# Patient Record
Sex: Female | Born: 1945 | Race: Black or African American | Hispanic: No | Smoking: Former smoker
Health system: Southern US, Community
[De-identification: ages and names within clinical notes are randomized; demographics above are authoritative.]

---

## 2012-03-10 ENCOUNTER — Other Ambulatory Visit (HOSPITAL_COMMUNITY): Payer: Self-pay | Admitting: Endocrinology

## 2012-03-10 DIAGNOSIS — E0590 Thyrotoxicosis, unspecified without thyrotoxic crisis or storm: Secondary | ICD-10-CM

## 2012-03-22 ENCOUNTER — Encounter (HOSPITAL_COMMUNITY)
Admission: RE | Admit: 2012-03-22 | Discharge: 2012-03-22 | Disposition: A | Discharge status: Home / Self Care | Payer: Federal, State, Local not specified - PPO | Source: Ambulatory Visit | Attending: Endocrinology | Admitting: Endocrinology

## 2012-03-22 DIAGNOSIS — E049 Nontoxic goiter, unspecified: Secondary | ICD-10-CM | POA: Insufficient documentation

## 2012-03-22 DIAGNOSIS — E0590 Thyrotoxicosis, unspecified without thyrotoxic crisis or storm: Secondary | ICD-10-CM

## 2012-03-22 MED ORDER — SODIUM IODIDE I 131 CAPSULE
8.9000 | Freq: Once | INTRAVENOUS | Status: AC | PRN
  Administered 2012-03-22: 8.9 via ORAL

## 2012-03-23 ENCOUNTER — Encounter (HOSPITAL_COMMUNITY)
Admission: RE | Admit: 2012-03-23 | Discharge: 2012-03-23 | Disposition: A | Discharge status: Home / Self Care | Payer: Federal, State, Local not specified - PPO | Source: Ambulatory Visit | Attending: Endocrinology | Admitting: Endocrinology

## 2012-03-23 MED ORDER — SODIUM PERTECHNETATE TC 99M INJECTION
10.0000 | Freq: Once | INTRAVENOUS | Status: AC | PRN
  Administered 2012-03-23: 10 via INTRAVENOUS

## 2018-05-12 ENCOUNTER — Inpatient Hospital Stay (HOSPITAL_COMMUNITY)
Admission: AD | Admit: 2018-05-12 | Discharge: 2018-05-17 | DRG: 922 | Disposition: A | Discharge status: Home / Self Care | Payer: Medicare Other | Source: Other Acute Inpatient Hospital | Attending: Internal Medicine | Admitting: Internal Medicine

## 2018-05-12 DIAGNOSIS — Z79899 Other long term (current) drug therapy: Secondary | ICD-10-CM | POA: Diagnosis not present

## 2018-05-12 DIAGNOSIS — T40601S Poisoning by unspecified narcotics, accidental (unintentional), sequela: Principal | ICD-10-CM

## 2018-05-12 DIAGNOSIS — I21A1 Myocardial infarction type 2: Secondary | ICD-10-CM | POA: Diagnosis not present

## 2018-05-12 DIAGNOSIS — G92 Toxic encephalopathy: Secondary | ICD-10-CM | POA: Diagnosis not present

## 2018-05-12 DIAGNOSIS — E876 Hypokalemia: Secondary | ICD-10-CM | POA: Diagnosis not present

## 2018-05-12 DIAGNOSIS — G931 Anoxic brain damage, not elsewhere classified: Secondary | ICD-10-CM | POA: Diagnosis present

## 2018-05-12 DIAGNOSIS — I6340 Cerebral infarction due to embolism of unspecified cerebral artery: Secondary | ICD-10-CM | POA: Diagnosis present

## 2018-05-12 DIAGNOSIS — M545 Low back pain: Secondary | ICD-10-CM | POA: Diagnosis not present

## 2018-05-12 DIAGNOSIS — Z7901 Long term (current) use of anticoagulants: Secondary | ICD-10-CM | POA: Diagnosis not present

## 2018-05-12 DIAGNOSIS — I129 Hypertensive chronic kidney disease with stage 1 through stage 4 chronic kidney disease, or unspecified chronic kidney disease: Secondary | ICD-10-CM | POA: Diagnosis present

## 2018-05-12 DIAGNOSIS — I119 Hypertensive heart disease without heart failure: Secondary | ICD-10-CM | POA: Diagnosis present

## 2018-05-12 DIAGNOSIS — N179 Acute kidney failure, unspecified: Secondary | ICD-10-CM

## 2018-05-12 DIAGNOSIS — I2510 Atherosclerotic heart disease of native coronary artery without angina pectoris: Secondary | ICD-10-CM | POA: Diagnosis not present

## 2018-05-12 DIAGNOSIS — G894 Chronic pain syndrome: Secondary | ICD-10-CM | POA: Diagnosis not present

## 2018-05-12 DIAGNOSIS — I10 Essential (primary) hypertension: Secondary | ICD-10-CM | POA: Diagnosis present

## 2018-05-12 DIAGNOSIS — I214 Non-ST elevation (NSTEMI) myocardial infarction: Secondary | ICD-10-CM | POA: Diagnosis present

## 2018-05-12 DIAGNOSIS — E669 Obesity, unspecified: Secondary | ICD-10-CM | POA: Diagnosis present

## 2018-05-12 DIAGNOSIS — Z9861 Coronary angioplasty status: Secondary | ICD-10-CM

## 2018-05-12 DIAGNOSIS — M79605 Pain in left leg: Secondary | ICD-10-CM | POA: Diagnosis not present

## 2018-05-12 DIAGNOSIS — J9601 Acute respiratory failure with hypoxia: Secondary | ICD-10-CM | POA: Diagnosis not present

## 2018-05-12 DIAGNOSIS — M5136 Other intervertebral disc degeneration, lumbar region: Secondary | ICD-10-CM | POA: Diagnosis present

## 2018-05-12 DIAGNOSIS — E1151 Type 2 diabetes mellitus with diabetic peripheral angiopathy without gangrene: Secondary | ICD-10-CM | POA: Diagnosis present

## 2018-05-12 DIAGNOSIS — T40601A Poisoning by unspecified narcotics, accidental (unintentional), initial encounter: Secondary | ICD-10-CM | POA: Diagnosis present

## 2018-05-12 DIAGNOSIS — T40601D Poisoning by unspecified narcotics, accidental (unintentional), subsequent encounter: Secondary | ICD-10-CM

## 2018-05-12 DIAGNOSIS — E785 Hyperlipidemia, unspecified: Secondary | ICD-10-CM | POA: Diagnosis present

## 2018-05-12 DIAGNOSIS — E0590 Thyrotoxicosis, unspecified without thyrotoxic crisis or storm: Secondary | ICD-10-CM | POA: Diagnosis present

## 2018-05-12 DIAGNOSIS — I169 Hypertensive crisis, unspecified: Secondary | ICD-10-CM

## 2018-05-12 DIAGNOSIS — I4891 Unspecified atrial fibrillation: Secondary | ICD-10-CM | POA: Diagnosis present

## 2018-05-12 DIAGNOSIS — I25119 Atherosclerotic heart disease of native coronary artery with unspecified angina pectoris: Secondary | ICD-10-CM

## 2018-05-12 DIAGNOSIS — I480 Paroxysmal atrial fibrillation: Secondary | ICD-10-CM | POA: Diagnosis not present

## 2018-05-12 DIAGNOSIS — Q6689 Other  specified congenital deformities of feet: Secondary | ICD-10-CM

## 2018-05-12 DIAGNOSIS — Z66 Do not resuscitate: Secondary | ICD-10-CM | POA: Diagnosis not present

## 2018-05-12 DIAGNOSIS — Z96641 Presence of right artificial hip joint: Secondary | ICD-10-CM | POA: Diagnosis present

## 2018-05-12 DIAGNOSIS — Z79891 Long term (current) use of opiate analgesic: Secondary | ICD-10-CM

## 2018-05-12 DIAGNOSIS — R2689 Other abnormalities of gait and mobility: Secondary | ICD-10-CM | POA: Diagnosis present

## 2018-05-12 DIAGNOSIS — J690 Pneumonitis due to inhalation of food and vomit: Secondary | ICD-10-CM | POA: Diagnosis not present

## 2018-05-12 DIAGNOSIS — E8770 Fluid overload, unspecified: Secondary | ICD-10-CM | POA: Diagnosis not present

## 2018-05-12 DIAGNOSIS — G8929 Other chronic pain: Secondary | ICD-10-CM | POA: Diagnosis not present

## 2018-05-12 DIAGNOSIS — R569 Unspecified convulsions: Secondary | ICD-10-CM | POA: Diagnosis present

## 2018-05-12 DIAGNOSIS — Z820 Family history of epilepsy and other diseases of the nervous system: Secondary | ICD-10-CM | POA: Diagnosis not present

## 2018-05-12 DIAGNOSIS — Z8673 Personal history of transient ischemic attack (TIA), and cerebral infarction without residual deficits: Secondary | ICD-10-CM | POA: Diagnosis not present

## 2018-05-12 DIAGNOSIS — Z6835 Body mass index (BMI) 35.0-35.9, adult: Secondary | ICD-10-CM

## 2018-05-12 DIAGNOSIS — Z713 Dietary counseling and surveillance: Secondary | ICD-10-CM

## 2018-05-12 DIAGNOSIS — S72002D Fracture of unspecified part of neck of left femur, subsequent encounter for closed fracture with routine healing: Secondary | ICD-10-CM | POA: Diagnosis not present

## 2018-05-12 DIAGNOSIS — G9340 Encephalopathy, unspecified: Secondary | ICD-10-CM | POA: Diagnosis not present

## 2018-05-12 DIAGNOSIS — I340 Nonrheumatic mitral (valve) insufficiency: Secondary | ICD-10-CM | POA: Diagnosis not present

## 2018-05-12 DIAGNOSIS — I248 Other forms of acute ischemic heart disease: Secondary | ICD-10-CM | POA: Diagnosis present

## 2018-05-12 DIAGNOSIS — N183 Chronic kidney disease, stage 3 (moderate): Secondary | ICD-10-CM | POA: Diagnosis present

## 2018-05-12 DIAGNOSIS — I6930 Unspecified sequelae of cerebral infarction: Secondary | ICD-10-CM | POA: Diagnosis not present

## 2018-05-12 MED ORDER — SODIUM CHLORIDE 0.9% FLUSH
3.0000 mL | Freq: Two times a day (BID) | INTRAVENOUS | Status: DC
  Administered 2018-05-13 – 2018-05-16 (×4): 3 mL via INTRAVENOUS

## 2018-05-12 MED ORDER — ACETAMINOPHEN 325 MG PO TABS
650.0000 mg | ORAL_TABLET | Freq: Four times a day (QID) | ORAL | Status: DC | PRN
  Administered 2018-05-14: 650 mg via ORAL
  Filled 2018-05-12: qty 2

## 2018-05-12 MED ORDER — ORAL CARE MOUTH RINSE
15.0000 mL | Freq: Two times a day (BID) | OROMUCOSAL | Status: DC
  Administered 2018-05-13 – 2018-05-17 (×5): 15 mL via OROMUCOSAL

## 2018-05-12 MED ORDER — SENNOSIDES-DOCUSATE SODIUM 8.6-50 MG PO TABS: 1.0000 | ORAL_TABLET | Freq: Every evening | ORAL | Status: DC | PRN

## 2018-05-12 MED ORDER — SODIUM CHLORIDE 0.9 % IV SOLN: 250.0000 mL | INTRAVENOUS | Status: DC | PRN

## 2018-05-12 MED ORDER — NALOXONE HCL 0.4 MG/ML IJ SOLN: 0.4000 mg | INTRAMUSCULAR | Status: DC | PRN

## 2018-05-12 MED ORDER — SODIUM CHLORIDE 0.9% FLUSH: 3.0000 mL | INTRAVENOUS | Status: DC | PRN

## 2018-05-12 MED ORDER — ACETAMINOPHEN 650 MG RE SUPP: 650.0000 mg | Freq: Four times a day (QID) | RECTAL | Status: DC | PRN

## 2018-05-12 MED ORDER — SODIUM CHLORIDE 0.9% FLUSH
3.0000 mL | Freq: Two times a day (BID) | INTRAVENOUS | Status: DC
  Administered 2018-05-13 – 2018-05-17 (×4): 3 mL via INTRAVENOUS

## 2018-05-12 MED ORDER — BISACODYL 10 MG RE SUPP: 10.0000 mg | Freq: Every day | RECTAL | Status: DC | PRN

## 2018-05-12 MED ORDER — ONDANSETRON HCL 4 MG PO TABS: 4.0000 mg | ORAL_TABLET | Freq: Four times a day (QID) | ORAL | Status: DC | PRN

## 2018-05-12 MED ORDER — ONDANSETRON HCL 4 MG/2ML IJ SOLN: 4.0000 mg | Freq: Four times a day (QID) | INTRAMUSCULAR | Status: DC | PRN

## 2018-05-12 NOTE — H&P (Signed)
History and Physical    Stephanie Hahn ZOX:[id number] DOB: 10-14-1945 DOA: 05/12/2018  PCP: Barron Alvine, MD   Patient coming from: Home, by way of [facility] ED  Chief Complaint: Found down, poorly responsive  HPI: Stephanie Hahn is a 72 y.o. female with medical history significant for chronic back pain, hyperthyroidism, coronary artery disease, hypertension, and depression, who presented to the [facility] emergency department with EMS after she was found on the floor at home, poorly responsive and hypoxic.  Family is at the bedside and assist with the history.  It had been approximately 24 hours since the family member had spoken with her on the phone, she did not return calls on the day of presentation, and when family went to check on her, she was found on the floor poorly responsive.  She was saturating in the 50s on EMS arrival with poor respiratory effort and poor responsiveness.  She responded to Narcan, had seizure-like activity that resolved with Versed, and was brought into the ED.  ED Course: Upon arrival to the ED, patient is found to be obtunded with poor respiratory effort, again responding well to Narcan, afebrile, tachycardic, with stable blood pressure, and saturating adequately on room air after Narcan administration.  EKG features sinus tachycardia with rate 113 and PVCs.  Noncontrast head CT was negative for acute intracranial abnormality.  Chest x-ray is notable for cardiomegaly, peribronchial thickening, and interstitial prominence, possibly suggestive of early interstitial edema.  Chemistry panel is notable for a creatinine of 2.40, up from a baseline of 1.0.  Ethanol level is undetectable and TSH was normal.  CBC features a leukocytosis to 17,100 with normal H&H and normal platelets.  Lactic acid was reassuringly normal, INR normal and urinalysis unremarkable.  Troponin was elevated at 1.78, decreased further to 3.0, and then 3.7 while in the ED.  Patient received Narcan multiple times,  Versed, 1 L of normal saline, duo nebs, cefepime, vancomycin, and Flagyl in the ED.  She was also given 125 mg of IV Solu-Medrol and started on a heparin infusion.  She has been transferred to stepdown unit at [facility] for further evaluation and management  Review of Systems:  Unable to complete ROS secondary .  History reviewed. No pertinent past medical history.  History reviewed. No pertinent surgical history.   has no tobacco, alcohol, and drug history on file. Unable to obtain secondary to patient's clinical condition.   Not on File  Family History  Problem Relation Age of Onset  . Alzheimer's disease Other   . Alzheimer's disease Other      Prior to Admission medications   Not on File    Physical Exam: Vitals:   05/12/18 2137 05/12/18 2141 05/12/18 2308 05/12/18 2326  BP: (!) 163/90   138/70  Pulse: 81   91  Resp: 12   (!) 9  Temp: 98.7 F (37.1 C)   98.4 F (36.9 C)  TempSrc: Oral   Oral  SpO2: 97%   97%  Weight:  95.4 kg    Height:   5\' 4"  (1.626 m)     Constitutional: NAD, calm  Eyes: PERTLA, lids and conjunctivae normal ENMT: Mucous membranes are moist. Posterior pharynx clear of any exudate or lesions.   Neck: normal, supple, no masses, no thyromegaly Respiratory: Scattered rales, no wheezes, no cough. No accessory muscle use.  Cardiovascular: S1 & S2 heard, regular rate and rhythm. Mild edema involving all extremities. Abdomen: No distension, no tenderness, soft. Bowel sounds active.  Musculoskeletal: no  clubbing / cyanosis. LLE shortened and externally rotated; pain with manipulation of left foot and ankle.  Skin: no significant rashes, lesions, ulcers. Warm, dry, well-perfused. Neurologic: Somnolent, easily woken. Makes eye-contact, but not speaking, does not seem to recognize family at bedside. No facial asymmetry, PERRL, EOMI. Moving all extremities.     Labs on Admission: I have personally reviewed following labs and imaging studies  CBC: Recent Labs    Lab June 01, 2018 0018  WBC 13.4*  NEUTROABS 10.4*  HGB 11.4*  HCT 38.4  MCV 96.5  PLT 147*   Basic Metabolic Panel: No results for input(s): NA, K, CL, CO2, GLUCOSE, BUN, CREATININE, CALCIUM, MG, PHOS in the last 168 hours. GFR: CrCl cannot be calculated (No successful lab value found.). Liver Function Tests: No results for input(s): AST, ALT, ALKPHOS, BILITOT, PROT, ALBUMIN in the last 168 hours. No results for input(s): LIPASE, AMYLASE in the last 168 hours. No results for input(s): AMMONIA in the last 168 hours. Coagulation Profile: No results for input(s): INR, PROTIME in the last 168 hours. Cardiac Enzymes: No results for input(s): CKTOTAL, CKMB, CKMBINDEX, TROPONINI in the last 168 hours. BNP (last 3 results) No results for input(s): PROBNP in the last 8760 hours. HbA1C: No results for input(s): HGBA1C in the last 72 hours. CBG: No results for input(s): GLUCAP in the last 168 hours. Lipid Profile: No results for input(s): CHOL, HDL, LDLCALC, TRIG, CHOLHDL, LDLDIRECT in the last 72 hours. Thyroid Function Tests: No results for input(s): TSH, T4TOTAL, FREET4, T3FREE, THYROIDAB in the last 72 hours. Anemia Panel: No results for input(s): VITAMINB12, FOLATE, FERRITIN, TIBC, IRON, RETICCTPCT in the last 72 hours. Urine analysis: No results found for: COLORURINE, APPEARANCEUR, LABSPEC, PHURINE, GLUCOSEU, HGBUR, BILIRUBINUR, KETONESUR, PROTEINUR, UROBILINOGEN, NITRITE, LEUKOCYTESUR Sepsis Labs: @LABRCNTIP (procalcitonin:4,lacticidven:4) ) Recent Results (from the past 240 hour(s))  MRSA PCR Screening     Status: None   Collection Time: 05/12/18  9:54 PM  Result Value Ref Range Status   MRSA by PCR NEGATIVE NEGATIVE Final    Comment:        The GeneXpert MRSA Assay (FDA approved for NASAL specimens only), is one component of a comprehensive MRSA colonization surveillance program. It is not intended to diagnose MRSA infection nor to guide or monitor treatment for MRSA  infections. Performed at [facility], [address], [place], [place] [address]      Radiological Exams on Admission: No results found.  EKG: Independently reviewed. Sinus tachycardia (rate 113), PVC's.   Assessment/Plan   1. Acute encephalopathy  - Presents after being found down at home, hypoxic with sat in 50's, low respiratory rate, poorly responsive, and with seizure-like episode; it had been ~24 hrs since a family had spoken to her on the phone  - Opiate overdose was suspected as she takes Percocet and responded well to Narcan  - Despite improvement in level of consciousness and respirations with Narcan, she remains disoriented, does not seem to recognize her family at bedside  - No acute findings noted on head CT in ED; TSH was also wnl in ED  - Discussed with neurology, their assistance much appreciated  - STAT head CT, EEG in am if not improving, neuro checks, seizure precautions, check B12, folate, ammonia, RPR   2. Acute respiratory failure with hypoxia  - She was hypoxic with sat in 50's on EMS arrival, improved with Narcan and was saturating mid-90's in ED  - This was suspected secondary to opiate overdose, but there was also  concern for aspiration or edema contributing  - She was started on broad-spectrum antibiotics in ED, remains afebrile, will continue Unasyn for now for possible aspiration PNA, repeat CXR now, check echocardiogram    3. Elevated troponin; CAD  - Troponin elevated to 1.78 -> 3.01 -> 3.70 in ED  - EKG without acute ischemic features in ED  - She has hx of PCI in 2012  - She was started on IV heparin infusion in ED  - Continue cardiac monitoring, continue IV heparin for now, repeat EKG, trend troponin, check echocardiogram    4. Seizure-like activity   - There was an episode of seizure-like activity in ED that resolved with Versed  - No acute findings on head CT in ED, repeating now  - Continue seizure precautions, EEG in am if her neuro  status fails to improve    5. Chronic pain  - She has chronic back pain managed at home with Percocet  - She is suspected to have suffered opiate overdose, responding to Narcan  - Appears comfortable at rest, but appears to be in pain with manipulation of left leg as discussed below  - Continue to avoid opiates for now   6. Acute left leg pain  - LLE appears shortened and externally rotated  - She appears to be in pain with manipulation of foot and ankle, but does not react to palpation of the hip  - Check radiographs    7. Acute kidney injury  - SCr is 2.40 in ED, up from baseline of ~1   - Appears hypervolemic on admission  - Had received a liter bolus in ED followed by LR infusion  - Check urine chemistries, repeat serum chemistries, renally-dose medications, avoid nephrotoxins    8. Hyperthyroidism  - TSH was wnl in ED on 12/4  - Resume Tapazole when more appropriate for diet   9. Hypertension  - BP at goal  - Treat as-needed for now, resume metoprolol if diet recommended by SLP      DVT prophylaxis: IV heparin infusion   Code Status: DNR  Family Communication: Son, Alinda Moneyony, updated at bedside Consults called: Neurology   Admission status: Inpatient     Briscoe Deutscherimothy S Jomayra Novitsky, MD [facility] Pager [phone]  If 7PM-7AM, please contact night-coverage [email or web address] Password [email or web address]  05/13/2018, 1:13 AM

## 2018-05-13 ENCOUNTER — Inpatient Hospital Stay (HOSPITAL_COMMUNITY): Payer: Medicare Other

## 2018-05-13 ENCOUNTER — Inpatient Hospital Stay (HOSPITAL_COMMUNITY): Payer: Federal, State, Local not specified - PPO

## 2018-05-13 ENCOUNTER — Encounter (HOSPITAL_COMMUNITY): Payer: Self-pay | Admitting: Family Medicine

## 2018-05-13 DIAGNOSIS — I639 Cerebral infarction, unspecified: Secondary | ICD-10-CM

## 2018-05-13 DIAGNOSIS — I10 Essential (primary) hypertension: Secondary | ICD-10-CM | POA: Diagnosis present

## 2018-05-13 DIAGNOSIS — M79605 Pain in left leg: Secondary | ICD-10-CM | POA: Diagnosis present

## 2018-05-13 DIAGNOSIS — F329 Major depressive disorder, single episode, unspecified: Secondary | ICD-10-CM | POA: Insufficient documentation

## 2018-05-13 DIAGNOSIS — T40601D Poisoning by unspecified narcotics, accidental (unintentional), subsequent encounter: Secondary | ICD-10-CM

## 2018-05-13 DIAGNOSIS — I2510 Atherosclerotic heart disease of native coronary artery without angina pectoris: Secondary | ICD-10-CM | POA: Diagnosis present

## 2018-05-13 DIAGNOSIS — F32A Depression, unspecified: Secondary | ICD-10-CM | POA: Insufficient documentation

## 2018-05-13 LAB — CBC WITH DIFFERENTIAL/PLATELET
Abs Immature Granulocytes: 0.08 10*3/uL — ABNORMAL HIGH (ref 0.00–0.07)
Basophils Absolute: 0 10*3/uL (ref 0.0–0.1)
Basophils Relative: 0 %
EOS ABS: 0 10*3/uL (ref 0.0–0.5)
Eosinophils Relative: 0 %
HCT: 38.4 % (ref 36.0–46.0)
Hemoglobin: 11.4 g/dL — ABNORMAL LOW (ref 12.0–15.0)
Immature Granulocytes: 1 %
Lymphocytes Relative: 13 %
Lymphs Abs: 1.8 10*3/uL (ref 0.7–4.0)
MCH: 28.6 pg (ref 26.0–34.0)
MCHC: 29.7 g/dL — ABNORMAL LOW (ref 30.0–36.0)
MCV: 96.5 fL (ref 80.0–100.0)
Monocytes Absolute: 1.2 10*3/uL — ABNORMAL HIGH (ref 0.1–1.0)
Monocytes Relative: 9 %
Neutro Abs: 10.4 10*3/uL — ABNORMAL HIGH (ref 1.7–7.7)
Neutrophils Relative %: 77 %
Platelets: 147 10*3/uL — ABNORMAL LOW (ref 150–400)
RBC: 3.98 MIL/uL (ref 3.87–5.11)
RDW: 12.3 % (ref 11.5–15.5)
WBC: 13.4 10*3/uL — ABNORMAL HIGH (ref 4.0–10.5)
nRBC: 0 % (ref 0.0–0.2)

## 2018-05-13 LAB — TROPONIN I
TROPONIN I: 3.1 ng/mL — AB (ref <0.03)
Troponin I: 2.46 ng/mL (ref <0.03)
Troponin I: 1.58 ng/mL (ref <0.03)

## 2018-05-13 LAB — COMPREHENSIVE METABOLIC PANEL
ALK PHOS: 76 U/L (ref 38–126)
ALT: 20 U/L (ref 0–44)
ANION GAP: 11 (ref 5–15)
AST: 32 U/L (ref 15–41)
Albumin: 3.3 g/dL — ABNORMAL LOW (ref 3.5–5.0)
BUN: 28 mg/dL — ABNORMAL HIGH (ref 8–23)
CO2: 26 mmol/L (ref 22–32)
Calcium: 8.6 mg/dL — ABNORMAL LOW (ref 8.9–10.3)
Chloride: 104 mmol/L (ref 98–111)
Creatinine, Ser: 1.76 mg/dL — ABNORMAL HIGH (ref 0.44–1.00)
GFR calc Af Amer: 33 mL/min — ABNORMAL LOW (ref >60)
GFR calc non Af Amer: 28 mL/min — ABNORMAL LOW (ref >60)
Glucose, Bld: 110 mg/dL — ABNORMAL HIGH (ref 70–99)
Potassium: 4.2 mmol/L (ref 3.5–5.1)
Sodium: 141 mmol/L (ref 135–145)
Total Bilirubin: 0.7 mg/dL (ref 0.3–1.2)
Total Protein: 6.5 g/dL (ref 6.5–8.1)

## 2018-05-13 LAB — SODIUM, URINE, RANDOM: Sodium, Ur: 18 mmol/L

## 2018-05-13 LAB — RPR: RPR Ser Ql: NONREACTIVE

## 2018-05-13 LAB — HEPARIN LEVEL (UNFRACTIONATED)
HEPARIN UNFRACTIONATED: 0.19 [IU]/mL — AB (ref 0.30–0.70)
Heparin Unfractionated: 0.29 IU/mL — ABNORMAL LOW (ref 0.30–0.70)

## 2018-05-13 LAB — AMMONIA: Ammonia: 40 umol/L — ABNORMAL HIGH (ref 9–35)

## 2018-05-13 LAB — RAPID URINE DRUG SCREEN, HOSP PERFORMED
Amphetamines: NOT DETECTED
Barbiturates: NOT DETECTED
Benzodiazepines: POSITIVE — AB
Cocaine: NOT DETECTED
Opiates: POSITIVE — AB
TETRAHYDROCANNABINOL: NOT DETECTED

## 2018-05-13 LAB — CREATININE, URINE, RANDOM: Creatinine, Urine: 195.78 mg/dL

## 2018-05-13 LAB — GLUCOSE, CAPILLARY: Glucose-Capillary: 105 mg/dL — ABNORMAL HIGH (ref 70–99)

## 2018-05-13 LAB — CK: Total CK: 240 U/L — ABNORMAL HIGH (ref 38–234)

## 2018-05-13 LAB — VITAMIN B12: Vitamin B-12: 159 pg/mL — ABNORMAL LOW (ref 180–914)

## 2018-05-13 LAB — BRAIN NATRIURETIC PEPTIDE: B Natriuretic Peptide: 270 pg/mL — ABNORMAL HIGH (ref 0.0–100.0)

## 2018-05-13 LAB — MRSA PCR SCREENING: MRSA by PCR: NEGATIVE

## 2018-05-13 LAB — HIV ANTIBODY (ROUTINE TESTING W REFLEX): HIV SCREEN 4TH GENERATION: NONREACTIVE

## 2018-05-13 LAB — STREP PNEUMONIAE URINARY ANTIGEN: Strep Pneumo Urinary Antigen: NEGATIVE

## 2018-05-13 MED ORDER — DILTIAZEM HCL 25 MG/5ML IV SOLN
10.0000 mg | Freq: Once | INTRAVENOUS | Status: DC
  Filled 2018-05-13: qty 5

## 2018-05-13 MED ORDER — DILTIAZEM HCL 25 MG/5ML IV SOLN
20.0000 mg | Freq: Once | INTRAVENOUS | Status: DC
  Filled 2018-05-13: qty 5

## 2018-05-13 MED ORDER — HEPARIN (PORCINE) 25000 UT/250ML-% IV SOLN
1400.0000 [IU]/h | INTRAVENOUS | Status: DC
  Administered 2018-05-13: 1100 [IU]/h via INTRAVENOUS
  Administered 2018-05-13 – 2018-05-15 (×3): 1400 [IU]/h via INTRAVENOUS
  Filled 2018-05-13 (×3): qty 250

## 2018-05-13 MED ORDER — METOPROLOL TARTRATE 5 MG/5ML IV SOLN
INTRAVENOUS | Status: AC
  Filled 2018-05-13: qty 5

## 2018-05-13 MED ORDER — METOPROLOL TARTRATE 5 MG/5ML IV SOLN
5.0000 mg | INTRAVENOUS | Status: AC | PRN
  Administered 2018-05-13 (×2): 5 mg via INTRAVENOUS
  Filled 2018-05-13: qty 5

## 2018-05-13 MED ORDER — METOPROLOL SUCCINATE ER 100 MG PO TB24
100.0000 mg | ORAL_TABLET | Freq: Every day | ORAL | Status: DC
  Administered 2018-05-13 – 2018-05-17 (×5): 100 mg via ORAL
  Filled 2018-05-13 (×5): qty 1

## 2018-05-13 MED ORDER — ATORVASTATIN CALCIUM 80 MG PO TABS
80.0000 mg | ORAL_TABLET | Freq: Every day | ORAL | Status: DC
  Administered 2018-05-13 – 2018-05-17 (×5): 80 mg via ORAL
  Filled 2018-05-13 (×5): qty 1

## 2018-05-13 MED ORDER — DILTIAZEM HCL-DEXTROSE 100-5 MG/100ML-% IV SOLN (PREMIX)
5.0000 mg/h | INTRAVENOUS | Status: DC
  Administered 2018-05-13 – 2018-05-14 (×2): 5 mg/h via INTRAVENOUS
  Filled 2018-05-13 (×2): qty 100

## 2018-05-13 MED ORDER — PANTOPRAZOLE SODIUM 40 MG PO TBEC
80.0000 mg | DELAYED_RELEASE_TABLET | Freq: Every day | ORAL | Status: DC
  Administered 2018-05-13 – 2018-05-17 (×5): 80 mg via ORAL
  Filled 2018-05-13 (×5): qty 2

## 2018-05-13 MED ORDER — DILTIAZEM LOAD VIA INFUSION
20.0000 mg | Freq: Once | INTRAVENOUS | Status: AC
  Administered 2018-05-13: 20 mg via INTRAVENOUS
  Filled 2018-05-13: qty 20

## 2018-05-13 MED ORDER — HYDRALAZINE HCL 20 MG/ML IJ SOLN
10.0000 mg | INTRAMUSCULAR | Status: DC | PRN
  Administered 2018-05-15 – 2018-05-16 (×2): 10 mg via INTRAVENOUS
  Filled 2018-05-13 (×2): qty 1

## 2018-05-13 MED ORDER — SODIUM CHLORIDE 0.9 % IV SOLN
3.0000 g | Freq: Three times a day (TID) | INTRAVENOUS | Status: DC
  Administered 2018-05-13: 3 g via INTRAVENOUS
  Filled 2018-05-13 (×2): qty 3

## 2018-05-13 MED ORDER — METHIMAZOLE 10 MG PO TABS
10.0000 mg | ORAL_TABLET | Freq: Every day | ORAL | Status: DC
  Administered 2018-05-13 – 2018-05-17 (×5): 10 mg via ORAL
  Filled 2018-05-13 (×5): qty 1

## 2018-05-13 MED ORDER — BUPROPION HCL ER (XL) 150 MG PO TB24: 150.0000 mg | ORAL_TABLET | Freq: Every morning | ORAL | Status: DC

## 2018-05-13 MED ORDER — STROKE: EARLY STAGES OF RECOVERY BOOK
Freq: Once | Status: DC
  Filled 2018-05-13: qty 1

## 2018-05-13 MED ORDER — DILTIAZEM HCL 25 MG/5ML IV SOLN
5.0000 mg | Freq: Once | INTRAVENOUS | Status: AC
  Administered 2018-05-13: 5 mg via INTRAVENOUS
  Filled 2018-05-13: qty 5

## 2018-05-13 NOTE — Progress Notes (Signed)
EEG completed, results pending. 

## 2018-05-13 NOTE — Progress Notes (Addendum)
Pt HR 160/180's Sinus Tach on the monitor then converted to Afib BP 131/117 . EKG done reading AFiib RVR. MD Blount paged.  Orders received for 5 mg lopressor q675min. Lopressor received x2. HR 120's-140's Afib. Md Blount paged orders received for 5 mg Cardizem IV once.   0349 HR 130-150's despite Cardizem 5mg . MD paged Bp 133/87  0351 Cardizem 10 mg ordered but not given, pt converted back to NSR 70-80's BP 154/78. Will continue to monitor.

## 2018-05-13 NOTE — Progress Notes (Signed)
ELECTROENCEPHALOGRAM REPORT Date of Study: 05/13/18  MRN: [id number]  Clinical History:  Stephanie Hahn is an 72 y.o. female with past medical history for chronic hypertension, coronary artery disease, chronic back pain on narcotics [facility] after found down at the floor poorly responsive and hypoxic.  Also normal was about 24 hours ago and family members talk to her on the phone and she did not return calls which prompted family to check up on her.  Per chart, EMS noted patient was saturating in the 50s with poor respiratory effort.  She responded to Narcan, which had and had seizure-like activity that resolved with Versed.  She received multiple doses of Narcan in the emergency department and was also started on antibiotics for leukocytosis.  She was also given 125 mg of IV Solu-Medrol and started on heparin drip for elevated troponin.  She was transferred to [facility] for further evaluation management. No sedation or AEDs  Medications:none   Technical Summary:  A multichannel digital EEG recording measured by the international 10-20 system with electrodes applied with paste and impedances below 5000 ohms performed in our laboratory with EKG monitoring in an awake and asleep patient. Hyperventilation and photic stimulation were not performed. The digital EEG was referentially recorded, reformatted, and digitally filtered in a variety of bipolar and referential montages for optimal display.  Description:  The patient is comatose and not responding, throughout the recording bilateral sharp waves were noted that built up at times and became rhythmic but no ictal phase was noticed in the recording. Diffuse triphasic waves were also noted that might represent severe encephalopathy.  There were no electrographic seizures seen but this recording is very concerning for a low seizure threshold.  EKG lead was unremarkable.  Impression:  This comatose EEG is  abnormal due to the presence of bilateral sharp and triphasic waves that became rhythmic at times with no electrographic seizure but a high concern for low seizure threshold, continues EEG is recommended.

## 2018-05-13 NOTE — Progress Notes (Addendum)
Brief note  Patient more awake and talkative per family. Able to say her name. No drift in b/l UE but poor attention concentration.   MRI with embolic strokes in setting of newly found Afib and possible b/l basal ganglia restricted diffusion, suggestive of toxic/metabolic/hypoxic injury.  EEG with slowing without any seizures. Concern for rhythmicity of slowing and some sharp built up but no seizrues.   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 OK for heparin drip (stroke protocol) Will need long term AC. Recs per stroke team  Stroke team to follow. Consider repeat EEG if deemed appropriate after AM assessment.  -- Milon DikesAshish Iretha Kirley, MD [facility] Pager: [phone] If 7pm to 7am, please call on call as listed on AMION.

## 2018-05-13 NOTE — Consult Note (Signed)
Requesting Physician: Dr. Antionette Char     Chief Complaint: Altered mental status   History obtained from: Patient and Chart   HPI:                                                                                                                                       Stephanie Hahn is an 72 y.o. female with past medical history for chronic hypertension, coronary artery disease, chronic back pain on narcotics presents to [facility] emergency department after found down at the floor poorly responsive and hypoxic.  Also normal was about 24 hours ago and family members talk to her on the phone and she did not return calls which prompted family to check up on her.  Per chart, EMS noted patient was saturating in the 50s with poor respiratory effort.  She responded to Narcan, which had and had seizure-like activity that resolved with Versed.  She received multiple doses of Narcan in the emergency department and was also started on antibiotics for leukocytosis.  She was also given 125 mg of IV Solu-Medrol and started on heparin drip for elevated troponin.  She was transferred to [facility] for further evaluation management.    PMH: Coronary artery disease HT  Family History  Problem Relation Age of Onset  . Alzheimer's disease Other   . Alzheimer's disease Other    Social History:  has no tobacco, alcohol, and drug history on file.  Allergies: Not on File  Medications:                                                                                                                        I reviewed home medications   ROS:  14 systems reviewed and negative except above   Examination:                                                                                                      General: Appears well-developed and well-nourished.  Psych: Affect appropriate to  situation Eyes: No scleral injection HENT: No OP obstrucion Head: Normocephalic.  Cardiovascular: Irregular rate and rhythm, tachycardic Respiratory: Effort normal and breath sounds normal to anterior ascultation GI: Soft.  No distension. There is no tenderness.  Skin: WDI   Neurological Examination Mental Status: Drowsy , answers simple questions and states her name.  Follows simple commands intermittently.  Cranial Nerves: II: Visual fields : Blinks to threat bilaterally III,IV, VI: ptosis not present, extra-ocular motions intact bilaterally, pupils equal, round, reactive to light and accommodation V,VII: smile symmetric, facial light touch sensation normal bilaterally VIII: hearing normal bilaterally IX,X: uvula rises symmetrically XI: bilateral shoulder shrug XII: midline tongue extension Motor: Right : Upper extremity   4/5    Left:     Upper extremity   4/5  Lower extremity   3/5     Lower extremity   3/5 Tone and bulk:normal tone throughout; no atrophy noted Asterixis noted on raising both arms  Sensory: Unable to assess accurately but appears to be symmetric on both sides Deep Tendon Reflexes: 1+ and symmetric throughout Plantars: Right: downgoing   Left: downgoing Cerebellar: Unable to assess Gait: UnAble to assess   Lab Results: Basic Metabolic Panel: Recent Labs  Lab 05/13/18 0018  NA 141  K 4.2  CL 104  CO2 26  GLUCOSE 110*  BUN 28*  CREATININE 1.76*  CALCIUM 8.6*    CBC: Recent Labs  Lab 05/13/18 0018  WBC 13.4*  NEUTROABS 10.4*  HGB 11.4*  HCT 38.4  MCV 96.5  PLT 147*    Coagulation Studies: No results for input(s): LABPROT, INR in the last 72 hours.  Imaging: No results found.   I have reviewed the above imaging    ASSESSMENT AND PLAN  72 y.o. female with past medical history for chronic hypertension, coronary artery disease, chronic back pain on narcotics consulted for altered mental status.  No unilateral deficits and asterixis  noted on exam favors hypercarbia and respirator failure likely due to narcotic overdose.  She was noted to be in A. fib with RVR on assessment and would recommend MRI Brain to r/o stroke. Also routine EEG in the morning, although low suspicion for seizures.    Acute toxic metabolic encephalopathy Opiate overdose  Atrial fibrillation with RVR   Recommendations  MRI Brain w/o contrast  Routine EEG tomorrow morning Check Ammonia, CK Continue treat underlying infection  OK to resume heparin drip, no obvious large stroke on CT head    Yahsir Wickens [facility] Pager Number [phone]

## 2018-05-13 NOTE — Progress Notes (Addendum)
PROGRESS NOTE    Stephanie Hahn  ZOX:[id number] DOB: 1946-04-03 DOA: 05/12/2018 PCP: Barron Alvine, MD     Brief Narrative:  Stephanie Hahn is a 72 y.o. female with medical history significant for chronic back pain, hyperthyroidism, coronary artery disease, hypertension, and depression, who presented to the [facility] emergency department with EMS after she was found on the floor at home, poorly responsive and hypoxic.  Family is at the bedside and assist with the history.  It had been approximately 24 hours since the family member had spoken with her on the phone, she did not return calls on the day of presentation, and when family went to check on her, she was found on the floor poorly responsive.  She was saturating in the 50s on EMS arrival with poor respiratory effort and poor responsiveness.  She responded to Narcan, had seizure-like activity that resolved with Versed, and was brought into the ED. Upon arrival to the ED, patient is found to be obtunded with poor respiratory effort, again responding well to Narcan, afebrile, tachycardic, with stable blood pressure, and saturating adequately on room air after Narcan administration.  EKG features sinus tachycardia with rate 113 and PVCs.  Noncontrast head CT was negative for acute intracranial abnormality.  Chest x-ray is notable for cardiomegaly, peribronchial thickening, and interstitial prominence, possibly suggestive of early interstitial edema.  Chemistry panel is notable for a creatinine of 2.40, up from a baseline of 1.0.  Ethanol level is undetectable and TSH was normal.  CBC features a leukocytosis to 17,100 with normal H&H and normal platelets.  Lactic acid was reassuringly normal, INR normal and urinalysis unremarkable.  Troponin was elevated at 1.78, increased further to 3.0, and then 3.7 while in the ED.  Patient received Narcan multiple times, Versed, 1 L of normal saline, duo nebs, cefepime, vancomycin, and Flagyl in the ED.  She was also given  125 mg of IV Solu-Medrol and started on a heparin infusion.  She has been transferred to stepdown unit at [facility] for further evaluation and management.  New events last 24 hours / Subjective: No new events overnight.  Daughter at bedside.  Patient is alert, responsive, although not at her baseline mentation.  Daughter states that normally, patient is quite loud and very conversant.  This morning, patient does answer questions appropriately although is very slow to answer questions and often stares off into space.  She does not have any acute complaints, no complaints of any chest pain, abdominal pain or leg pain.  She is unable to tell me what happened at home.  Assessment & Plan:   Principal Problem:   Overdose opiate, accidental or unintentional, subsequent encounter Active Problems:   NSTEMI (non-ST elevated myocardial infarction) (HCC)   Aspiration pneumonia (HCC)   Chronic pain   Acute encephalopathy   Seizure-like activity (HCC)   Opiate overdose (HCC)   Left leg pain   Essential hypertension   CAD (coronary artery disease)   Acute hypoxic respiratory failure Improved with Narcan Chest x-ray reviewed independently, no focal consolidation noted.  Stop Unasyn Now on room air  Embolic CVA Neurology following Echo pending   Acute metabolic encephalopathy Differential diagnosis with opioid overdose, as her encephalopathy improved with Narcan administration. UDS positive for opiates and benzo  CT head negative  MRI brain: Bilateral globus pallidus restricted diffusion which may reflect acute injury from hypoxia, toxic exposure, or metabolic disturbance Neurology following  NSTEMI type II Likely demand ischemia in setting of hypoxic event Troponin peaked at  3.7 and trended downward EKG reviewed independently which revealed normal sinus rhythm without ST change  Patient denies any chest pain Echocardiogram pending Continue IV heparin until echocardiogram results  Seizure-like  activity Episode of seizure-like activity in the emergency department that resolved with Versed Continue seizure precaution EEG pending  Acute kidney injury Baseline creatinine 1 Trending downward, continue to monitor BMP  Hyperthyroidism Continue Tapazole, metoprolol  HLD Continue lipitor   Chronic pain Patient has chronic back pain managed at home with Percocet No pain currently  Acute left leg pain No pain on my physical exam this morning Imaging of the left lower extremity without acute fracture, although hip x-ray reveals very subtle nondisplaced left femoral neck fracture cannot be excluded. May pursue CT scan if patient has any complaints of leg pain   DVT prophylaxis: IV Heparin Code Status: DNR. Discussed with daughter; she states that she and some other family members would like patient to be fully resuscitated in any event but patient would not want to be on life support. She states patient's son recalled that patient had mentioned to him that she did not want to be resuscitated and that's why patient was listed as DNR on admission. I informed daughter that it is imperative to have all of the family members get together and confirm code status and be on the same page.  Family Communication: Daughter at bedside Disposition Plan: Pending further work up    Consultants:   Neurology  Procedures:   None   Antimicrobials:  Anti-infectives (From admission, onward)   Start     Dose/Rate Route Frequency Ordered Stop   05/13/18 0600  Ampicillin-Sulbactam (UNASYN) 3 g in sodium chloride 0.9 % 100 mL IVPB  Status:  Discontinued     3 g 200 mL/hr over 30 Minutes Intravenous Every 8 hours 05/13/18 0146 05/13/18 1240       Objective: Vitals:   05/13/18 0401 05/13/18 0534 05/13/18 0700 05/13/18 1133  BP:   (!) 153/80 (!) 162/96  Pulse:   77 80  Resp:   11 11  Temp: 99.7 F (37.6 C)  99.1 F (37.3 C) 98.4 F (36.9 C)  TempSrc: Oral  Oral Oral  SpO2:   94% 98%    Weight:  96.5 kg    Height:        Intake/Output Summary (Last 24 hours) at 05/13/2018 1243 Last data filed at 05/13/2018 0534 Gross per 24 hour  Intake 25.89 ml  Output 400 ml  Net -374.11 ml   Filed Weights   05/12/18 2141 05/12/18 2308 05/13/18 0534  Weight: 95.4 kg 95.4 kg 96.5 kg    Examination:  General exam: Appears calm and comfortable  Respiratory system: Clear to auscultation. Respiratory effort normal. Cardiovascular system: S1 & S2 heard, RRR. No JVD, murmurs, rubs, gallops or clicks. No pedal edema. Gastrointestinal system: Abdomen is nondistended, soft and nontender. No organomegaly or masses felt. Normal bowel sounds heard. Central nervous system: Alert and oriented, although slow to respond  Extremities: Symmetric, no pain with palpation of left hip  Skin: No rashes, lesions or ulcers  Data Reviewed: I have personally reviewed following labs and imaging studies  CBC: Recent Labs  Lab 05/13/18 0018  WBC 13.4*  NEUTROABS 10.4*  HGB 11.4*  HCT 38.4  MCV 96.5  PLT 147*   Basic Metabolic Panel: Recent Labs  Lab 05/13/18 0018  NA 141  K 4.2  CL 104  CO2 26  GLUCOSE 110*  BUN 28*  CREATININE  1.76*  CALCIUM 8.6*   GFR: Estimated Creatinine Clearance: 32.6 mL/min (A) (by C-G formula based on SCr of 1.76 mg/dL (H)). Liver Function Tests: Recent Labs  Lab 05/13/18 0018  AST 32  ALT 20  ALKPHOS 76  BILITOT 0.7  PROT 6.5  ALBUMIN 3.3*   No results for input(s): LIPASE, AMYLASE in the last 168 hours. Recent Labs  Lab 05/13/18 0018  AMMONIA 40*   Coagulation Profile: No results for input(s): INR, PROTIME in the last 168 hours. Cardiac Enzymes: Recent Labs  Lab 05/13/18 0018 05/13/18 0559  CKTOTAL 240*  --   TROPONINI 3.10* 2.46*   BNP (last 3 results) No results for input(s): PROBNP in the last 8760 hours. HbA1C: No results for input(s): HGBA1C in the last 72 hours. CBG: Recent Labs  Lab 05/13/18 0931  GLUCAP 105*   Lipid  Profile: No results for input(s): CHOL, HDL, LDLCALC, TRIG, CHOLHDL, LDLDIRECT in the last 72 hours. Thyroid Function Tests: No results for input(s): TSH, T4TOTAL, FREET4, T3FREE, THYROIDAB in the last 72 hours. Anemia Panel: Recent Labs    05/13/18 0018  VITAMINB12 159*   Sepsis Labs: No results for input(s): PROCALCITON, LATICACIDVEN in the last 168 hours.  Recent Results (from the past 240 hour(s))  MRSA PCR Screening     Status: None   Collection Time: 05/12/18  9:54 PM  Result Value Ref Range Status   MRSA by PCR NEGATIVE NEGATIVE Final    Comment:        The GeneXpert MRSA Assay (FDA approved for NASAL specimens only), is one component of a comprehensive MRSA colonization surveillance program. It is not intended to diagnose MRSA infection nor to guide or monitor treatment for MRSA infections. Performed at [facility], [address] [place], [place] [address]        Radiology Studies: Ct Head Wo Contrast  Result Date: 05/13/2018 CLINICAL DATA:  Initial evaluation for acute encephalopathy, found down. EXAM: CT HEAD WITHOUT CONTRAST TECHNIQUE: Contiguous axial images were obtained from the base of the skull through the vertex without intravenous contrast. COMPARISON:  None available. FINDINGS: Brain: Examination mildly degraded by motion artifact. Cerebral volume normal for age. No acute intracranial hemorrhage. No acute large vessel territory infarct. No mass lesion, midline shift or mass effect. No hydrocephalus. No extra-axial fluid collection. Vascular: No hyperdense vessel. Calcified atherosclerosis at the skull base. Skull: Scalp soft tissues and calvarium within normal limits. Sinuses/Orbits: Globes and orbital soft tissues demonstrate no acute finding. Paranasal sinuses and mastoid air cells are clear. Other: None. IMPRESSION: Negative head CT.  No acute intracranial abnormality identified. Electronically Signed   By: Rise Mu M.D.   On: 05/13/2018  02:08   Mr Brain Wo Contrast  Result Date: 05/13/2018 CLINICAL DATA:  Found down at home poorly responsive and hypoxic. Seizure like activity. Atrial fibrillation and possible opiate overdose. EXAM: MRI HEAD WITHOUT CONTRAST TECHNIQUE: Multiplanar, multiecho pulse sequences of the brain and surrounding structures were obtained without intravenous contrast. COMPARISON:  Head CT 05/13/2018 FINDINGS: Brain: There are symmetric subcentimeter foci of restricted diffusion and T2 hyperintensity in the globus pallidus bilaterally. Additional small foci of restricted diffusion involve cortex and subcortical white matter in the right parietal lobe as well as mesial left temporal lobe cortex and possibly right frontal white matter at the level of the centrum semiovale. No infarcts are present in the brainstem or cerebellum. No definite intracranial hemorrhage, mass, midline shift, or extra-axial fluid collection is identified. The ventricles  and sulci are normal. Scattered small foci of T2 hyperintensity in the cerebral white matter bilaterally are nonspecific but compatible with chronic small vessel ischemic disease. There is a chronic lacunar infarct in the white matter adjacent to the left frontal horn. Dedicated temporal lobe imaging is mildly motion degraded with symmetric volume and signal of the hippocampi. Vascular: Major intracranial vascular flow voids are preserved. Skull and upper cervical spine: No suspicious marrow lesion. Disc degeneration at C3-4 and C4-5 with suspected spinal stenosis but no gross cord compression. Sinuses/Orbits: Bilateral cataract extraction. Clear paranasal sinuses. Trace right mastoid effusion. Other: None. IMPRESSION: 1. Bilateral globus pallidus restricted diffusion which may reflect acute injury from hypoxia, toxic exposure, or metabolic disturbance. 2. Additional scattered punctate foci of restricted diffusion in both cerebral hemispheres suggesting acute embolic infarcts. 3. Mild  chronic small vessel ischemic disease. Electronically Signed   By: Sebastian Ache M.D.   On: 05/13/2018 09:25   Dg Chest Port 1 View  Result Date: 05/13/2018 CLINICAL DATA:  Recently found unresponsive EXAM: PORTABLE CHEST 1 VIEW COMPARISON:  02/08/2016 FINDINGS: Cardiac shadow is mildly prominent but stable. The lungs are well aerated bilaterally. Patchy interstitial markings are noted particularly in the apices which may be related to some underlying bronchitic markings. No sizable infiltrate or effusion is seen. No pneumothorax is noted. Degenerative change of the thoracic spine is noted IMPRESSION: Increased interstitial markings particularly in the apices. No focal confluent infiltrate is noted. Electronically Signed   By: Alcide Clever M.D.   On: 05/13/2018 06:17   Dg Ankle Left Port  Result Date: 05/13/2018 CLINICAL DATA:  Found unresponsive recently with ankle swelling EXAM: PORTABLE LEFT ANKLE - 2 VIEW COMPARISON:  None. 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 Some slight subluxation posteriorly of the talus with respect to the distal tibia is noted. Degenerative changes about the tarsal bones and ankle joint are seen with generalized soft tissue swelling. No acute fracture is noted. IMPRESSION: Chronic appearing changes without acute bony abnormality. Electronically Signed   By: Alcide Clever M.D.   On: 05/13/2018 06:19   Dg Foot 2 Views Left  Result Date: 05/13/2018 CLINICAL DATA:  Recently found unresponsive. Foot swelling is noted. EXAM: LEFT FOOT - 2 VIEW COMPARISON:  None. FINDINGS: Tarsal degenerative changes are noted. Degenerative changes at the first MTP are seen with hallux valgus deformity. Generalized foot swelling is noted. Some posterior subluxation of the talus with respect to the distal tibia is seen. 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 No prior films are available for comparison however.  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 Some mild subluxation of the talus with respect to the distal tibia is noted. No acute fracture is seen. Electronically Signed   By: Alcide Clever M.D.   On: 05/13/2018 06:18   Dg Hip Unilat With Pelvis 2-3 Views Left  Result Date: 05/13/2018 CLINICAL DATA:  Left leg pain. EXAM: DG HIP (WITH OR WITHOUT PELVIS) 2-3V LEFT COMPARISON:  Lumbar spine 03/18/2018.  CT abdomen pelvis 11/27/2009. FINDINGS: Probe noted over the rectum. Degenerative changes lumbar spine and left hip. Total right hip replacement. Hardware intact. A very subtle nondisplaced left femoral neck fracture cannot be excluded. No evidence of dislocation. Pelvic calcifications consistent phleboliths. Aortoiliac and peripheral vascular calcification. IMPRESSION: 1. A very subtle nondisplaced left femoral neck fracture cannot be excluded. No evidence of dislocation. 2. Degenerative  changes lumbar spine and left hip. Total right hip replacement. Hardware intact. 3.  Aortoiliac and peripheral vascular disease. Electronically Signed   By: Maisie Fus  Register   On: 05/13/2018 06:14      Scheduled Meds: . atorvastatin  80 mg Oral Daily  . diltiazem  10 mg Intravenous Once  . mouth rinse  15 mL Mouth Rinse BID  . methimazole  10 mg Oral Daily  . metoprolol succinate  100 mg Oral Daily  . metoprolol tartrate      . pantoprazole  80 mg Oral Daily  . sodium chloride flush  3 mL Intravenous Q12H  . sodium chloride flush  3 mL Intravenous Q12H   Continuous Infusions: . sodium chloride    . heparin 1,200 Units/hr (05/13/18 0657)     LOS: 1 day    Time spent: 45 minutes   Noralee Stain, DO [facility] [email or web address] Password [facility]  05/13/2018, 12:43 PM

## 2018-05-13 NOTE — Progress Notes (Signed)
1621: Pt HR 165/170's ST/ A-fib on the monitor. BP: 185/96 Paged MD. New order received for Cardizem gtt.     1833: Pt converted to SR. EKG done to confirm.  MD notified. No new orders. BP: 148/87, HR 73. Will continue to monitor.

## 2018-05-13 NOTE — Progress Notes (Signed)
ANTICOAGULATION CONSULT NOTE   Pharmacy Consult for Heparin  Indication: chest pain/ACS  No Known Allergies  Patient Measurements: Height: 5\' 4"  (162.6 cm) Weight: 212 lb 11.9 oz (96.5 kg) IBW/kg (Calculated) : 54.7  Vital Signs: Temp: 99 F (37.2 C) (12/06 1553) Temp Source: Oral (12/06 1553) BP: 185/96 (12/06 1553) Pulse Rate: 93 (12/06 1553)  Labs: Recent Labs    05/13/18 0018 05/13/18 0559 05/13/18 1142 05/13/18 1600  HGB 11.4*  --   --   --   HCT 38.4  --   --   --   PLT 147*  --   --   --   HEPARINUNFRC  --  0.29*  --  0.19*  CREATININE 1.76*  --   --   --   CKTOTAL 240*  --   --   --   TROPONINI 3.10* 2.46* 1.58*  --     Estimated Creatinine Clearance: 32.6 mL/min (A) (by C-G formula based on SCr of 1.76 mg/dL (H)).   Medical History: History reviewed. No pertinent past medical history.   Assessment: 72 yoF on IV heparin for AFib RVR and also with elevated troponins and possible cerebral infarcts. Heparin level subtherapeutic at 0.19 despite rate increase this morning. No issues with infusion per RN. Given possible acute CVA will avoid boluses and adjust goal to 0.3-0.5.  Goal of Therapy:  Heparin level 0.3-0.5 units/ml Monitor platelets by anticoagulation protocol: Yes   Plan:  -Increase heparin to 1400 units/hr -Recheck heparin level with morning labs  Stephanie Hahn Fredin, PharmD, BCPS Clinical Pharmacist [phone] Please check AMION for all [facility] numbers 05/13/2018

## 2018-05-13 NOTE — Progress Notes (Signed)
Pharmacy Antibiotic Note  Stephanie Hahn is a 72 y.o. female admitted on 05/12/2018 with presumed opioid overdose.  Pharmacy has been consulted for Unasyn dosing for aspiration PNA. WBC 13.4. Noted renal dysfunction.   [facility] Vancomycin 1500 mg IV x 1 Cefepime 2g IV x 1 Flagyl 500 mg IV x 1   Plan: Unasyn 3g IV q8h Trend WBC, temp, renal function  F/U infectious work-up  Height: 5\' 4"  (162.6 cm) Weight: 210 lb 5.1 oz (95.4 kg) IBW/kg (Calculated) : 54.7  Temp (24hrs), Avg:98.6 F (37 C), Min:98.4 F (36.9 C), Max:98.7 F (37.1 C)  Recent Labs  Lab 05/13/18 0018  WBC 13.4*    CrCl cannot be calculated (No successful lab value found.).    Not on File    Abran DukeLedford, Kari Montero 05/13/2018 1:15 AM

## 2018-05-13 NOTE — Progress Notes (Signed)
ANTICOAGULATION CONSULT NOTE   Pharmacy Consult for Heparin  Indication: chest pain/ACS  Not on File  Patient Measurements: Height: 5\' 4"  (162.6 cm) Weight: 212 lb 11.9 oz (96.5 kg) IBW/kg (Calculated) : 54.7  Vital Signs: Temp: 99.7 F (37.6 C) (12/06 0401) Temp Source: Oral (12/06 0401) BP: 146/76 (12/06 0400) Pulse Rate: 85 (12/06 0400)  Labs: Recent Labs    05/13/18 0018 05/13/18 0559  HGB 11.4*  --   HCT 38.4  --   PLT 147*  --   HEPARINUNFRC  --  0.29*  CREATININE 1.76*  --   CKTOTAL 240*  --   TROPONINI 3.10*  --     Estimated Creatinine Clearance: 32.6 mL/min (A) (by C-G formula based on SCr of 1.76 mg/dL (H)).   Medical History: History reviewed. No pertinent past medical history.   Assessment: 72 y/o F transfer from outside hospital for presumed opioid overdose, transferred on heparin drip at 1100 units/hr for elevated troponin (3.7). Hgb 11.4.   12/6 AM update: initial heparin level here is just below goal, no issues per RN.   Goal of Therapy:  Heparin level 0.3-0.7 units/ml Monitor platelets by anticoagulation protocol: Yes   Plan:  -Inc heparin to 1200 units/hr -Heparin level in 6-8 hours  Abran DukeLedford, Brizeyda Holtmeyer 05/13/2018,6:53 AM

## 2018-05-13 NOTE — Progress Notes (Addendum)
ANTICOAGULATION CONSULT NOTE - Initial Consult   Pharmacy Consult for Heparin  Indication: chest pain/ACS  Not on File  Patient Measurements: Height: 5\' 4"  (162.6 cm) Weight: 210 lb 5.1 oz (95.4 kg) IBW/kg (Calculated) : 54.7  Vital Signs: Temp: 98.4 F (36.9 C) (12/05 2326) Temp Source: Oral (12/05 2326) BP: 138/70 (12/05 2326) Pulse Rate: 91 (12/05 2326)  Labs: Recent Labs    05/13/18 0018  HGB 11.4*  HCT 38.4  PLT 147*    CrCl cannot be calculated (No successful lab value found.).   Medical History: History reviewed. No pertinent past medical history.   Assessment: 72 y/o F transfer from outside hospital for presumed opioid overdose, transferred on heparin drip at 1100 units/hr for elevated troponin (3.7). Hgb 11.4.   Goal of Therapy:  Heparin level 0.3-0.7 units/ml Monitor platelets by anticoagulation protocol: Yes   Plan:  -Cont heparin at 1100 units/hr -0500 HL  Ejay Lashley 05/13/2018,12:51 AM

## 2018-05-13 NOTE — Progress Notes (Signed)
CRITICAL VALUE ALERT  Critical Value:  Troponin  3.10  Date & Time Notied:  05/13/18 0130  Provider Notified :Bruna PotterBlount MD  No new orders received will continue to monitor.

## 2018-05-13 NOTE — Evaluation (Signed)
Clinical/Bedside Swallow Evaluation Patient Details  Name: Stephanie Hahn MRN: [id number] Date of Birth: 10/13/1945  Today's Date: 05/13/2018 Time: SLP Start Time (ACUTE ONLY): 1044 SLP Stop Time (ACUTE ONLY): 1106 SLP Time Calculation (min) (ACUTE ONLY): 22 min  Past Medical History: History reviewed. No pertinent past medical history. Past Surgical History: History reviewed. No pertinent surgical history. HPI:  Stephanie Hahn is a 72 y.o. female with medical history significant for chronic back pain, hyperthyroidism, coronary artery disease, hypertension, and depression, who presented to the [facility] emergency department with EMS after she was found on the floor at home, poorly responsive, hypoxic with presumed opioid overdose. Head CT was negative for acute intracranial abnormality. CXR increased interstitial markings particularly in the apices. No focal confluent infiltrate is noted. Aspiration suspected while down.    Assessment / Plan / Recommendation Clinical Impression  Presently pt's efficiency of oral management, attention to PO's and limited volume impacted by lethargy and ability to remain alert and frequent verbal/tactile cues needed. Upper dentiton (uses when eating) is at home and exhibited decreased manipulation of softened graham cracker in applesauce (decreased mastication prior to initiating swallow). Prognosis to return to baseline swallow function is good given additional time. In interim, ordered regular texture diet and educated daughter re: ordering softer items on menu. Family to being upper dentures. Will follow pt briefly.   SLP Visit Diagnosis: Dysphagia, unspecified (R13.10)    Aspiration Risk  Mild aspiration risk    Diet Recommendation Regular;Thin liquid(rec family order softer initially)   Liquid Administration via: Cup;Straw Medication Administration: Crushed with puree Supervision: Patient able to self feed;Staff to assist with self feeding Compensations: Slow  rate;Small sips/bites Postural Changes: Seated upright at 90 degrees    Other  Recommendations Oral Care Recommendations: Oral care BID   Follow up Recommendations None      Frequency and Duration min 1 x/week  1 week       Prognosis Prognosis for Safe Diet Advancement: Good      Swallow Study   General HPI: Stephanie Hahn is a 72 y.o. female with medical history significant for chronic back pain, hyperthyroidism, coronary artery disease, hypertension, and depression, who presented to the [facility] emergency department with EMS after she was found on the floor at home, poorly responsive, hypoxic with presumed opioid overdose. Head CT was negative for acute intracranial abnormality. CXR increased interstitial markings particularly in the apices. No focal confluent infiltrate is noted. Aspiration suspected while down.  Type of Study: Bedside Swallow Evaluation Previous Swallow Assessment: (none) Diet Prior to this Study: NPO Temperature Spikes Noted: Yes Respiratory Status: Room air History of Recent Intubation: No Behavior/Cognition: Lethargic/Drowsy;Cooperative;Requires cueing Oral Cavity Assessment: Other (comment)(lingual candidias) Oral Care Completed by SLP: No Oral Cavity - Dentition: Missing dentition;Dentures, not available(some natural, family to bring denture/partial) Vision: Functional for self-feeding Self-Feeding Abilities: Needs assist Patient Positioning: Upright in bed Baseline Vocal Quality: Low vocal intensity Volitional Cough: Weak    Oral/Motor/Sensory Function Overall Oral Motor/Sensory Function: Generalized oral weakness(from deconditioning)   Ice Chips Ice chips: Not tested   Thin Liquid Thin Liquid: Within functional limits Presentation: Cup;Straw    Nectar Thick Nectar Thick Liquid: Not tested   Honey Thick Honey Thick Liquid: Not tested   Puree Puree: Within functional limits   Solid     Solid: Impaired Oral Phase Impairments: Reduced lingual  movement/coordination(cracker in puree) Pharyngeal Phase Impairments: (none)      Stephanie Hahn, Stephanie Coons 05/13/2018,11:34 AM  Stephanie Hahn [employer]Ed Sports administrator  Pager [phone] Office [phone]

## 2018-05-14 ENCOUNTER — Inpatient Hospital Stay (HOSPITAL_COMMUNITY): Payer: Medicare Other

## 2018-05-14 DIAGNOSIS — I6340 Cerebral infarction due to embolism of unspecified cerebral artery: Secondary | ICD-10-CM

## 2018-05-14 DIAGNOSIS — T40601D Poisoning by unspecified narcotics, accidental (unintentional), subsequent encounter: Secondary | ICD-10-CM

## 2018-05-14 DIAGNOSIS — I340 Nonrheumatic mitral (valve) insufficiency: Secondary | ICD-10-CM

## 2018-05-14 LAB — CBC
HEMATOCRIT: 37.6 % (ref 36.0–46.0)
Hemoglobin: 11.2 g/dL — ABNORMAL LOW (ref 12.0–15.0)
MCH: 28.6 pg (ref 26.0–34.0)
MCHC: 29.8 g/dL — ABNORMAL LOW (ref 30.0–36.0)
MCV: 95.9 fL (ref 80.0–100.0)
Platelets: 159 10*3/uL (ref 150–400)
RBC: 3.92 MIL/uL (ref 3.87–5.11)
RDW: 12 % (ref 11.5–15.5)
WBC: 9 10*3/uL (ref 4.0–10.5)
nRBC: 0 % (ref 0.0–0.2)

## 2018-05-14 LAB — BASIC METABOLIC PANEL
Anion gap: 9 (ref 5–15)
BUN: 16 mg/dL (ref 8–23)
CO2: 29 mmol/L (ref 22–32)
Calcium: 8.9 mg/dL (ref 8.9–10.3)
Chloride: 106 mmol/L (ref 98–111)
Creatinine, Ser: 1.1 mg/dL — ABNORMAL HIGH (ref 0.44–1.00)
GFR calc Af Amer: 58 mL/min — ABNORMAL LOW (ref >60)
GFR calc non Af Amer: 50 mL/min — ABNORMAL LOW (ref >60)
Glucose, Bld: 118 mg/dL — ABNORMAL HIGH (ref 70–99)
Potassium: 3.9 mmol/L (ref 3.5–5.1)
Sodium: 144 mmol/L (ref 135–145)

## 2018-05-14 LAB — HEMOGLOBIN A1C
Hgb A1c MFr Bld: 4.4 % — ABNORMAL LOW (ref 4.8–5.6)
Mean Plasma Glucose: 79.58 mg/dL

## 2018-05-14 LAB — LIPID PANEL
CHOLESTEROL: 153 mg/dL (ref 0–200)
HDL: 43 mg/dL (ref >40)
LDL Cholesterol: 85 mg/dL (ref 0–99)
Total CHOL/HDL Ratio: 3.6 RATIO
Triglycerides: 124 mg/dL (ref <150)
VLDL: 25 mg/dL (ref 0–40)

## 2018-05-14 LAB — GLUCOSE, CAPILLARY: Glucose-Capillary: 111 mg/dL — ABNORMAL HIGH (ref 70–99)

## 2018-05-14 LAB — TROPONIN I: Troponin I: 1.56 ng/mL (ref <0.03)

## 2018-05-14 LAB — ECHOCARDIOGRAM COMPLETE
Height: 64 in
Weight: 3305.14 [oz_av]

## 2018-05-14 LAB — HEPARIN LEVEL (UNFRACTIONATED)
Heparin Unfractionated: 0.32 IU/mL (ref 0.30–0.70)
Heparin Unfractionated: 0.42 IU/mL (ref 0.30–0.70)

## 2018-05-14 MED ORDER — HEPARIN (PORCINE) IN NACL 25000-0.45 UT/250ML-% IV SOLN
12.00 | INTRAVENOUS | Status: DC
Start: ? — End: 2018-05-14

## 2018-05-14 MED ORDER — HEPARIN SODIUM (PORCINE) 1000 UNIT/ML IJ SOLN
2000.00 | INTRAMUSCULAR | Status: DC
Start: ? — End: 2018-05-14

## 2018-05-14 NOTE — Progress Notes (Signed)
STROKE TEAM PROGRESS NOTE   HISTORY OF PRESENT ILLNESS (per record) Stephanie Hahn is an 72 y.o. female with past medical history for chronic hypertension, coronary artery disease, chronic back pain on narcotics presents to [facility] emergency department after found down at the floor poorly responsive and hypoxic.  Also normal was about 24 hours ago and family members talk to her on the phone and she did not return calls which prompted family to check up on her.  Per chart, EMS noted patient was saturating in the 50s with poor respiratory effort.  She responded to Narcan, which had and had seizure-like activity that resolved with Versed.  She received multiple doses of Narcan in the emergency department and was also started on antibiotics for leukocytosis.  She was also given 125 mg of IV Solu-Medrol and started on heparin drip for elevated troponin.  She was transferred to [facility] for further evaluation management.   SUBJECTIVE (INTERVAL HISTORY) Pt's daughter at the bedside. Pt unable to give a history. She is able to answer orientation questions correctly. No aphasia.    OBJECTIVE Vitals:   05/13/18 2346 05/14/18 0000 05/14/18 0412 05/14/18 0500  BP:  (!) 155/65 (!) 155/70   Pulse:   69   Resp:  11 10   Temp: 98.7 F (37.1 C)  97.9 F (36.6 C)   TempSrc: Oral  Oral   SpO2:      Weight:    93.7 kg  Height:        CBC:  Recent Labs  Lab 05/13/18 0018 05/14/18 0158  WBC 13.4* 9.0  NEUTROABS 10.4*  --   HGB 11.4* 11.2*  HCT 38.4 37.6  MCV 96.5 95.9  PLT 147* 159    Basic Metabolic Panel:  Recent Labs  Lab 05/13/18 0018 05/14/18 0158  NA 141 144  K 4.2 3.9  CL 104 106  CO2 26 29  GLUCOSE 110* 118*  BUN 28* 16  CREATININE 1.76* 1.10*  CALCIUM 8.6* 8.9    Lipid Panel:     Component Value Date/Time   CHOL 153 05/14/2018 0158   TRIG 124 05/14/2018 0158   HDL 43 05/14/2018 0158   CHOLHDL 3.6 05/14/2018 0158   VLDL 25 05/14/2018 0158   LDLCALC 85  05/14/2018 0158   HgbA1c:  Lab Results  Component Value Date   HGBA1C 4.4 (L) 05/14/2018   Urine Drug Screen:     Component Value Date/Time   LABOPIA POSITIVE (A) 05/13/2018 0047   COCAINSCRNUR NONE DETECTED 05/13/2018 0047   LABBENZ POSITIVE (A) 05/13/2018 0047   AMPHETMU NONE DETECTED 05/13/2018 0047   THCU NONE DETECTED 05/13/2018 0047   LABBARB NONE DETECTED 05/13/2018 0047    Alcohol Level No results found for: ETH  IMAGING   Ct Head Wo Contrast 05/13/2018 IMPRESSION:  Negative head CT.  No acute intracranial abnormality identified.    Mr Brain Wo Contrast 05/13/2018 IMPRESSION:  1. Bilateral globus pallidus restricted diffusion which may reflect acute injury from hypoxia, toxic exposure, or metabolic disturbance.  2. Additional scattered punctate foci of restricted diffusion in both cerebral hemispheres suggesting acute embolic infarcts.  3. Mild chronic small vessel ischemic disease.     Dg Chest Port 1 View 05/13/2018 IMPRESSION:  Increased interstitial markings particularly in the apices. No focal confluent infiltrate is noted.   Dg Ankle Left Port 05/13/2018 IMPRESSION:  Chronic appearing changes without acute bony abnormality.    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 Some mild subluxation of the talus with respect to the distal tibia is noted. No acute fracture is seen.    Dg Hip Unilat With Pelvis 2-3 Views Left 05/13/2018  IMPRESSION:  1. A very subtle nondisplaced left femoral neck fracture cannot be excluded. No evidence of dislocation. 2. Degenerative changes lumbar spine and left hip. Total right hip replacement. Hardware intact.  3.  Aortoiliac and peripheral vascular disease.     Transthoracic Echocardiogram  05/14/2018 Study Conclusions - Left ventricle: The cavity size was normal. Wall thickness was   increased in a pattern of  mild to moderate LVH. Systolic function   was normal. The estimated ejection fraction was 65%. Wall motion   was normal; there were no regional wall motion abnormalities.   Features are consistent with a pseudonormal left ventricular   filling pattern, with concomitant abnormal relaxation and   increased filling pressure (grade 2 diastolic dysfunction).   Doppler parameters are consistent with high ventricular filling   pressure. - Aortic valve: Mildly calcified annulus. Trileaflet. - Mitral valve: Mildly calcified annulus. Normal thickness leaflets   . There was mild regurgitation. Valve area by pressure half-time:   1.62 cm^2. - Atrial septum: No defect or patent foramen ovale was identified. - Tricuspid valve: There was mild regurgitation. - Pulmonary arteries: PA peak pressure: 33 mm Hg (S).    Bilateral Carotid Dopplers  05/14/2018 Summary: Right Carotid: Velocities in the right ICA are consistent with a 1-39% stenosis.        Upper end of range. Left Carotid: Velocities in the left ICA are consistent with a 40-59% stenosis.       Mid to upper end of range. Vertebrals: Bilateral vertebral arteries demonstrate antegrade flow.    PHYSICAL EXAM Blood pressure (!) 155/70, pulse 69, temperature 97.9 F (36.6 C), temperature source Oral, resp. rate 10, height 5\' 4"  (1.626 m), weight 93.7 kg, SpO2 98 %. Pleasant elderly African American lady sitting up comfortably in a chair not in distress. . Afebrile. Head is nontraumatic. Neck is supple without bruit.    Cardiac exam no murmur or gallop. Lungs are clear to auscultation. Distal pulses are well felt.  Neurological Exam ;  Awake alert nonfluent speech and ounces only simple questions and speaks short sentences. Oriented to place and time. Diminished attention, registration and recall. No dysarthria extraocular moments are full range without nystagmus. Blinks to threat bilaterally. Fundi not visualized. Face is  symmetric. Tongue midline. Motor system exam shows symmetric upper and lower extremity strength with mild symmetric bilateral lower extremity weakness. Sensation appears preserved. Gait not tested.   ASSESSMENT/PLAN Stephanie Hahn is a 72 y.o. female with history of chronic hypertension, coronary artery disease, chronic back pain on narcotics found down at the floor poorly responsive and hypoxic with seizure-like activity that resolved with Versed.  She did not receive IV t-PA due to  Unknown time of onset.  Stroke:  Bilateral globus pallidus infarcts - likely from hypoxic injury but she also has a right parietal embolic infarcts from the onset atrial fibrillation    Resultant  Altered mental status which appears to be improving possibly from hypoxic encephalopathy.  CT head - Negative  MRI head - Bilateral globus pallidus restricted diffusion which may reflect acute injury from hypoxia, toxic exposure, or metabolic disturbance. Additional scattered punctate foci of restricted diffusion in both cerebral hemispheres suggesting acute embolic infarcts.   MRA head - not  ordered  CTA H&N - not ordered  Carotid Doppler - Lt ICA - 40-59% stenosis.  2D Echo - EF 65%. No cardiac source of emboli identified.   EEG - pending  LDL - 85  HgbA1c - 4.4  UDS - opiates and benzodiazepines  VTE prophylaxis - IV heparin (for elevated troponin)  Diet - regular  No antithrombotic prior to admission, now on heparin IV  Patient counseled to be compliant with her antithrombotic medications  Ongoing aggressive stroke risk factor management  Therapy recommendations:  CIR recommended  Disposition:  Pending  Hypertension  Stable . Permissive hypertension (OK if < 220/120) but gradually normalize in 5-7 days . Long-term BP goal normotensive  Hyperlipidemia  Lipid lowering medication PTA: Lipitor 80 mg daily  LDL 85, goal < 70  Current lipid lowering medication: Lipitor 80 mg daily -  consider adding Zetia  Continue statin at discharge  Diabetes  HgbA1c 4.4, goal < 7.0  Controlled  Other Stroke Risk Factors  Advanced age  Obesity, Body mass index is 35.46 kg/m., recommend weight loss, diet and exercise as appropriate   Coronary artery disease   Other Active Problems  Seizure like activity PTA -> EEG  Elevated creatinine - improving  Plan  Check EEG   Anticoagulant therapy when off heparin  Hospital day # 2  Delton See PA-C [facility] Pager [phone] 05/14/2018, 3:48 PM I have personally obtained history,examined this patient, reviewed notes, independently viewed imaging studies, participated in medical decision making and plan of care.ROS completed by me personally and pertinent positives fully documented  I have made any additions or clarifications directly to the above note. Agree with note above.  She presented with possibly narcotic overdose and MRI scan shows symmetric global pallidus hyperintensities but she also has new-onset A. Fib and embolic right parietal strokes so a mixed picture. She will need long-term anticoagulation when able to swallow safely. Continue ongoing stroke workup. Check EEG for seizures. Long discussion with the patient and  daughter the bedside and answered questions.greater than 50% time during this 35 minute visit was spent on counseling and coordination of care about her strokes, atrial fibrillation and discussion about stroke prevention and treatment  Delia Heady, MD Medical Director [facility] Pager: [phone] 05/14/2018 4:00 PM   To contact Stroke Continuity provider, please refer to [email or web address]. After hours, contact General Neurology

## 2018-05-14 NOTE — Plan of Care (Signed)

## 2018-05-14 NOTE — Progress Notes (Signed)
*  PRELIMINARY RESULTS* Echocardiogram 2D Echocardiogram has been performed.  Stephanie Hahn, Stephanie Hahn 05/14/2018, 7:36 AM

## 2018-05-14 NOTE — Progress Notes (Signed)
Rehab Admissions Coordinator Note:  Per PT recommendation, patient was screened by Nanine MeansKelly Shauntavia Brackin for appropriateness for an Inpatient Acute Rehab Consult.  [facility] with therapies to help determine most appropriate post acute venue.  Will continue to follow.    Nanine MeansKelly Kalmen Lollar 05/14/2018, 5:08 PM  I can be reached at [phone].

## 2018-05-14 NOTE — Progress Notes (Signed)
ANTICOAGULATION CONSULT NOTE   Pharmacy Consult for Heparin  Indication: chest pain/ACS  No Known Allergies  Patient Measurements: Height: 5\' 4"  (162.6 cm) Weight: 212 lb 11.9 oz (96.5 kg) IBW/kg (Calculated) : 54.7  Vital Signs: Temp: 97.9 F (36.6 C) (12/07 0412) Temp Source: Oral (12/07 0412) BP: 155/70 (12/07 0412) Pulse Rate: 69 (12/07 0412)  Labs: Recent Labs    05/13/18 0018 05/13/18 0559 05/13/18 1142 05/13/18 1600 05/14/18 0158  HGB 11.4*  --   --   --  11.2*  HCT 38.4  --   --   --  37.6  PLT 147*  --   --   --  159  HEPARINUNFRC  --  0.29*  --  0.19* 0.32  CREATININE 1.76*  --   --   --  1.10*  CKTOTAL 240*  --   --   --   --   TROPONINI 3.10* 2.46* 1.58*  --  1.56*    Estimated Creatinine Clearance: 52.1 mL/min (A) (by C-G formula based on SCr of 1.1 mg/dL (H)).   Medical History: History reviewed. No pertinent past medical history.   Assessment: 72 y/o F transfer from outside hospital for presumed opioid overdose, transferred on heparin drip at 1100 units/hr for elevated troponin (3.7). Hgb 11.4.   12/7 AM update: heparin level therapeutic x 1 after rate increase, MRI now showing ischemic stroke, heparin goal lowered   Goal of Therapy:  Heparin level 0.3-0.5 units/mL, acute ischemic stroke Monitor platelets by anticoagulation protocol: Yes   Plan:  -Cont heparin at 1400 units/hr -Heparin level in 6-8 hours  Abran DukeLedford, Aamari Strawderman 05/14/2018,4:28 AM

## 2018-05-14 NOTE — Progress Notes (Signed)
ANTICOAGULATION CONSULT NOTE   Pharmacy Consult for Heparin  Indication: chest pain/ACS  No Known Allergies  Patient Measurements: Height: 5\' 4"  (162.6 cm) Weight: 206 lb 9.1 oz (93.7 kg) IBW/kg (Calculated) : 54.7  Vital Signs: Temp: 98.7 F (37.1 C) (12/07 0748) Temp Source: Oral (12/07 0748) BP: 168/95 (12/07 1100) Pulse Rate: 68 (12/07 1100)  Labs: Recent Labs    05/13/18 0018  05/13/18 0559 05/13/18 1142 05/13/18 1600 05/14/18 0158 05/14/18 1142  HGB 11.4*  --   --   --   --  11.2*  --   HCT 38.4  --   --   --   --  37.6  --   PLT 147*  --   --   --   --  159  --   HEPARINUNFRC  --    < > 0.29*  --  0.19* 0.32 0.42  CREATININE 1.76*  --   --   --   --  1.10*  --   CKTOTAL 240*  --   --   --   --   --   --   TROPONINI 3.10*  --  2.46* 1.58*  --  1.56*  --    < > = values in this interval not displayed.    Estimated Creatinine Clearance: 51.3 mL/min (A) (by C-G formula based on SCr of 1.1 mg/dL (H)).   Medical History: History reviewed. No pertinent past medical history.   Assessment: 72 y/o F transfer from outside hospital for presumed opioid overdose, transferred on heparin drip for elevated troponin (3.7), also with afib.   Heparin level at goal this afternoon, no bleeding or complications noted.  CBC fairly stable.  Goal of Therapy:  Heparin level 0.3-0.5 units/mL, acute ischemic stroke Monitor platelets by anticoagulation protocol: Yes   Plan:  -Cont heparin at 1400 units/hr -Daily heparin level and CBC. -F/u plans for oral anticoagulation eventually  Jenetta DownerJessica Akiyah Eppolito, Pharm D, BCPS, [facility] Clinical Pharmacist Phone [phone]  05/14/2018 1:24 PM

## 2018-05-14 NOTE — Evaluation (Addendum)
Physical Therapy Evaluation Patient Details Name: Stephanie PebblesSara Hahn MRN: [id number] DOB: [birth year] Today's Date: 05/14/2018   History of Present Illness  Pt is a 72 y.o. F with significant PMH of chronic back pain, CAD, hypertension, depression who presents after unresponsive, hypoxic event. MRI brain showing bilateral globus pallidus restricted diffusion which may reflect acute injury from hypoxia, toxic exposure or metabolic disturbance. Of note, imaging of left lower extremity without acute fracture but hip x-ray reveals very subtle nondisplaced left femoral neck fracture.   Clinical Impression  Pt admitted with above diagnosis. Prior to admission, pt daughter reports patient independent with mobility and ADL's. 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 On PT evaluation, patient presenting with cognitive impairments including decreased orientation, awareness, and attention, weakness, and poor motor coordination/initiation. Pt requiring max assist for bed mobility and two person total assistance for low pivot transfer from bed to chair. However, question whether amount of assistance is secondary to inattention rather than lack of true ability. Demonstrates fair sitting balance with hand support. Recommend CIR to maximize functional independence. Suspect patient will progress well based on PLOF and family support. Will follow acutely.     Follow Up Recommendations CIR    Equipment Recommendations  Other (comment)(TBA)    Recommendations for Other Services Rehab consult     Precautions / Restrictions Precautions Precautions: Fall Precaution Comments: watch BP Restrictions Weight Bearing Restrictions: No      Mobility  Bed Mobility Overal bed mobility: Needs Assistance Bed Mobility: Supine to Sit     Supine to sit: Max assist     General bed mobility comments: Pt requiring max assist to perform supine > sit. Likely impaired due to  cognition rather than true assessment of mobility  Transfers Overall transfer level: Needs assistance Equipment used: 2 person hand held assist Transfers: Sit to/from [employer] CorporationStand;Squat Pivot Transfers Sit to Stand: Max assist;+2 physical assistance   Squat pivot transfers: Total assist;+2 physical assistance     General transfer comment: Unable to achieve complete upright positioning with maxA + 2; poor effort. Total A + 2 for low pivot from bed to chair  Ambulation/Gait                Stairs            Wheelchair Mobility    Modified Rankin (Stroke Patients Only) Modified Rankin (Stroke Patients Only) Pre-Morbid Rankin Score: No symptoms Modified Rankin: Severe disability     Balance Overall balance assessment: Needs assistance Sitting-balance support: Bilateral upper extremity supported;Feet supported Sitting balance-Leahy Scale: Fair                                       Pertinent Vitals/Pain Pain Assessment: Faces Faces Pain Scale: No hurt  Vitals:  Prior to mobility: 152/81 Post mobility: 181/87 (RN notified) HR stable in 70s    Home Living Family/patient expects to be discharged to:: Private residence Living Arrangements: Alone Available Help at Discharge: Family Type of Home: House Home Access: Stairs to enter Entrance Stairs-Rails: None Entrance Stairs-Number of Steps: 2 Home Layout: One level Home Equipment: None      Prior Function Level of Independence: Independent               Hand Dominance        Extremity/Trunk Assessment   Upper Extremity Assessment Upper Extremity Assessment:  Difficult to assess due to impaired cognition;Generalized weakness    Lower Extremity Assessment Lower Extremity Assessment: Difficult to assess due to impaired cognition;Generalized weakness;LLE deficits/detail LLE Deficits / Details: 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        Communication    Communication: Other (comment)(minimal verbalization)  Cognition Arousal/Alertness: Awake/alert Behavior During Therapy: Flat affect Overall Cognitive Status: Impaired/Different from baseline Area of Impairment: Orientation;Attention;Memory;Following commands;Safety/judgement;Awareness                 Orientation Level: Disoriented to;Place;Time;Situation Current Attention Level: Focused Memory: Decreased short-term memory Following Commands: Follows one step commands inconsistently Safety/Judgement: Decreased awareness of deficits Awareness: Intellectual   General Comments: Pt with very poor attention; follows simple commands 25-50% of the time with increased time and frequent redirection      General Comments      Exercises     Assessment/Plan    PT Assessment Patient needs continued PT services  PT Problem List Decreased strength;Decreased activity tolerance;Decreased balance;Decreased mobility;Decreased coordination;Decreased cognition;Decreased safety awareness       PT Treatment Interventions DME instruction;Gait training;Functional mobility training;Therapeutic activities;Therapeutic exercise;Balance training;Patient/family education    PT Goals (Current goals can be found in the Care Plan section)  Acute Rehab PT Goals Patient Stated Goal: none stated; pt daughter interested in rehab PT Goal Formulation: With patient/family Time For Goal Achievement: 05/28/18 Potential to Achieve Goals: Good    Frequency Min 4X/week   Barriers to discharge        Co-evaluation               AM-PAC PT "6 Clicks" Mobility  Outcome Measure Help needed turning from your back to your side while in a flat bed without using bedrails?: A Lot Help needed moving from lying on your back to sitting on the side of a flat bed without using bedrails?: A Lot Help needed moving to and from a bed to a chair (including a wheelchair)?: Total Help needed standing up from a chair  using your arms (e.g., wheelchair or bedside chair)?: Total Help needed to walk in hospital room?: Total Help needed climbing 3-5 steps with a railing? : Total 6 Click Score: 8    End of Session Equipment Utilized During Treatment: Gait belt;Oxygen Activity Tolerance: Patient tolerated treatment well Patient left: in chair;with call bell/phone within reach;with family/visitor present;with chair alarm set Nurse Communication: Mobility status PT Visit Diagnosis: Other abnormalities of gait and mobility (R26.89);Other symptoms and signs involving the nervous system (R29.898)    Time: 3664-4034 PT Time Calculation (min) (ACUTE ONLY): 26 min   Charges:   PT Evaluation $PT Eval Moderate Complexity: 1 Mod PT Treatments $Therapeutic Activity: 8-22 mins        Laurina Bustle, PT, DPT Acute Rehabilitation Services Pager [phone] Office [phone]   Vanetta Mulders 05/14/2018, 12:35 PM

## 2018-05-14 NOTE — Progress Notes (Addendum)
PROGRESS NOTE    Stephanie Hahn  ZOX:[id number] DOB: 08/18/1945 DOA: 05/12/2018 PCP: Barron Alvine, MD     Brief Narrative:  Stephanie Hahn is a 72 y.o. female with medical history significant for chronic back pain, hyperthyroidism, coronary artery disease, hypertension, and depression, who presented to the [facility] emergency department with EMS after she was found on the floor at home, poorly responsive and hypoxic.  Family is at the bedside and assist with the history.  It had been approximately 24 hours since the family member had spoken with her on the phone, she did not return calls on the day of presentation, and when family went to check on her, she was found on the floor poorly responsive.  She was saturating in the 50s on EMS arrival with poor respiratory effort and poor responsiveness.  She responded to Narcan, had seizure-like activity that resolved with Versed, and was brought into the ED. Upon arrival to the ED, patient is found to be obtunded with poor respiratory effort, again responding well to Narcan, afebrile, tachycardic, with stable blood pressure, and saturating adequately on room air after Narcan administration.  EKG features sinus tachycardia with rate 113 and PVCs.  Noncontrast head CT was negative for acute intracranial abnormality.  Chest x-ray is notable for cardiomegaly, peribronchial thickening, and interstitial prominence, possibly suggestive of early interstitial edema.  Chemistry panel is notable for a creatinine of 2.40, up from a baseline of 1.0.  Ethanol level is undetectable and TSH was normal.  CBC features a leukocytosis to 17,100 with normal H&H and normal platelets.  Lactic acid was reassuringly normal, INR normal and urinalysis unremarkable.  Troponin was elevated at 1.78, increased further to 3.0, and then 3.7 while in the ED.  Patient received Narcan multiple times, Versed, 1 L of normal saline, duo nebs, cefepime, vancomycin, and Flagyl in the ED.  She was also given  125 mg of IV Solu-Medrol and started on a heparin infusion.  She has been transferred to stepdown unit at [facility] for further evaluation and management.  New events last 24 hours / Subjective: No new events, reviewed MRI findings with daughter at bedside   Assessment & Plan:   Principal Problem:   Overdose opiate, accidental or unintentional, subsequent encounter Active Problems:   NSTEMI (non-ST elevated myocardial infarction) (HCC)   Aspiration pneumonia (HCC)   Chronic pain   Acute encephalopathy   Seizure-like activity (HCC)   Opiate overdose (HCC)   Left leg pain   Essential hypertension   CAD (coronary artery disease)   Cerebral embolism with cerebral infarction   Acute metabolic encephalopathy Differential diagnosis with opioid overdose, as her encephalopathy improved with Narcan administration. UDS positive for opiates and benzo  CT head negative  MRI brain: Bilateral globus pallidus restricted diffusion which may reflect acute injury from hypoxia, toxic exposure, or metabolic disturbance EEG: The patient is comatose and not responding, throughout the recording bilateral sharp waves were noted that built up at times and became rhythmic but no ictal phase was noticed in the recording. Diffuse triphasic waves were also noted that might represent severe encephalopathy. Neurology following  Embolic CVA Neurology following Echo pending  Carotid US pending  PT OT SLP   A Fib RVR Now normal sinus rhythm on cardizem gtt --stop  Continue toprol  Heparin gtt, will need longterm AC   Acute hypoxic respiratory failure Improved with Narcan Chest x-ray reviewed independently, no focal consolidation noted.  Stop Unasyn Now on room air  NSTEMI type II  Likely demand ischemia in setting of hypoxic event Troponin peaked at 3.7 and trended downward to 1.56  EKG reviewed independently which revealed normal sinus rhythm without ST change  Patient denies any chest pain Echocardiogram  pending  Seizure-like activity Episode of seizure-like activity in the emergency department that resolved with Versed Continue seizure precaution EEG: The patient is comatose and not responding, throughout the recording bilateral sharp waves were noted that built up at times and became rhythmic but no ictal phase was noticed in the recording. Diffuse triphasic waves were also noted that might represent severe encephalopathy.  Acute kidney injury Baseline creatinine 1 Trending downward, continue to monitor BMP  Hyperthyroidism Continue Tapazole, metoprolol  HLD Continue lipitor   Chronic pain Patient has chronic back pain managed at home with Percocet No pain currently  Acute left leg pain No pain on my physical exam this morning Imaging of the left lower extremity without acute fracture, although hip x-ray reveals very subtle nondisplaced left femoral neck fracture cannot be excluded. May pursue CT scan if patient has any complaints of leg pain   DVT prophylaxis: IV Heparin Code Status: Discussed with daughter at bedside  Family Communication: Daughter at bedside who has discussed with rest of patient's children, they would like patient to be Full Code with full resuscitation. Difference between resuscitation and long-term life support discussed  Disposition Plan: Pending further work up    Consultants:   Neurology  Procedures:   None   Antimicrobials:  Anti-infectives (From admission, onward)   Start     Dose/Rate Route Frequency Ordered Stop   05/13/18 0600  Ampicillin-Sulbactam (UNASYN) 3 g in sodium chloride 0.9 % 100 mL IVPB  Status:  Discontinued     3 g 200 mL/hr over 30 Minutes Intravenous Every 8 hours 05/13/18 0146 05/13/18 1240       Objective: Vitals:   05/14/18 0412 05/14/18 0500 05/14/18 0748 05/14/18 1100  BP: (!) 155/70  (!) 178/87 (!) 168/95  Pulse: 69  (!) 131 68  Resp: 10  11   Temp: 97.9 F (36.6 C)  98.7 F (37.1 C)   TempSrc: Oral  Oral    SpO2:   94%   Weight:  93.7 kg    Height:        Intake/Output Summary (Last 24 hours) at 05/14/2018 1128 Last data filed at 05/14/2018 0600 Gross per 24 hour  Intake 444.14 ml  Output 875 ml  Net -430.86 ml   Filed Weights   05/12/18 2308 05/13/18 0534 05/14/18 0500  Weight: 95.4 kg 96.5 kg 93.7 kg     Examination: General exam: Appears calm and comfortable  Respiratory system: Clear to auscultation. Respiratory effort normal. Cardiovascular system: S1 & S2 heard, RRR. No JVD, murmurs, rubs, gallops or clicks. No pedal edema. Gastrointestinal system: Abdomen is nondistended, soft and nontender. No organomegaly or masses felt. Normal bowel sounds heard. Central nervous system: Alert but not oriented to place or year. Does follow commands although very slow, strength equal and symmetric  Extremities: Symmetric  Skin: No rashes, lesions or ulcers   Data Reviewed: I have personally reviewed following labs and imaging studies  CBC: Recent Labs  Lab 05/13/18 0018 05/14/18 0158  WBC 13.4* 9.0  NEUTROABS 10.4*  --   HGB 11.4* 11.2*  HCT 38.4 37.6  MCV 96.5 95.9  PLT 147* 159   Basic Metabolic Panel: Recent Labs  Lab 05/13/18 0018 05/14/18 0158  NA 141 144  K 4.2 3.9  CL  104 106  CO2 26 29  GLUCOSE 110* 118*  BUN 28* 16  CREATININE 1.76* 1.10*  CALCIUM 8.6* 8.9   GFR: Estimated Creatinine Clearance: 51.3 mL/min (A) (by C-G formula based on SCr of 1.1 mg/dL (H)). Liver Function Tests: Recent Labs  Lab 05/13/18 0018  AST 32  ALT 20  ALKPHOS 76  BILITOT 0.7  PROT 6.5  ALBUMIN 3.3*   No results for input(s): LIPASE, AMYLASE in the last 168 hours. Recent Labs  Lab 05/13/18 0018  AMMONIA 40*   Coagulation Profile: No results for input(s): INR, PROTIME in the last 168 hours. Cardiac Enzymes: Recent Labs  Lab 05/13/18 0018 05/13/18 0559 05/13/18 1142 05/14/18 0158  CKTOTAL 240*  --   --   --   TROPONINI 3.10* 2.46* 1.58* 1.56*   BNP (last 3  results) No results for input(s): PROBNP in the last 8760 hours. HbA1C: Recent Labs    05/14/18 0158  HGBA1C 4.4*   CBG: Recent Labs  Lab 05/13/18 0931 05/14/18 0803  GLUCAP 105* 111*   Lipid Profile: Recent Labs    05/14/18 0158  CHOL 153  HDL 43  LDLCALC 85  TRIG 124  CHOLHDL 3.6   Thyroid Function Tests: No results for input(s): TSH, T4TOTAL, FREET4, T3FREE, THYROIDAB in the last 72 hours. Anemia Panel: Recent Labs    05/13/18 0018  VITAMINB12 159*   Sepsis Labs: No results for input(s): PROCALCITON, LATICACIDVEN in the last 168 hours.  Recent Results (from the past 240 hour(s))  MRSA PCR Screening     Status: None   Collection Time: 05/12/18  9:54 PM  Result Value Ref Range Status   MRSA by PCR NEGATIVE NEGATIVE Final    Comment:        The GeneXpert MRSA Assay (FDA approved for NASAL specimens only), is one component of a comprehensive MRSA colonization surveillance program. It is not intended to diagnose MRSA infection nor to guide or monitor treatment for MRSA infections. Performed at [facility], [address], [place], [place] [address]        Radiology Studies: Ct Head Wo Contrast  Result Date: 05/13/2018 CLINICAL DATA:  Initial evaluation for acute encephalopathy, found down. EXAM: CT HEAD WITHOUT CONTRAST TECHNIQUE: Contiguous axial images were obtained from the base of the skull through the vertex without intravenous contrast. COMPARISON:  None available. FINDINGS: Brain: Examination mildly degraded by motion artifact. Cerebral volume normal for age. No acute intracranial hemorrhage. No acute large vessel territory infarct. No mass lesion, midline shift or mass effect. No hydrocephalus. No extra-axial fluid collection. Vascular: No hyperdense vessel. Calcified atherosclerosis at the skull base. Skull: Scalp soft tissues and calvarium within normal limits. Sinuses/Orbits: Globes and orbital soft tissues demonstrate no acute finding.  Paranasal sinuses and mastoid air cells are clear. Other: None. IMPRESSION: Negative head CT.  No acute intracranial abnormality identified. Electronically Signed   By: Rise Mu M.D.   On: 05/13/2018 02:08   Mr Brain Wo Contrast  Result Date: 05/13/2018 CLINICAL DATA:  Found down at home poorly responsive and hypoxic. Seizure like activity. Atrial fibrillation and possible opiate overdose. EXAM: MRI HEAD WITHOUT CONTRAST TECHNIQUE: Multiplanar, multiecho pulse sequences of the brain and surrounding structures were obtained without intravenous contrast. COMPARISON:  Head CT 05/13/2018 FINDINGS: Brain: There are symmetric subcentimeter foci of restricted diffusion and T2 hyperintensity in the globus pallidus bilaterally. Additional small foci of restricted diffusion involve cortex and subcortical white matter in the right parietal lobe as  well as mesial left temporal lobe cortex and possibly right frontal white matter at the level of the centrum semiovale. No infarcts are present in the brainstem or cerebellum. No definite intracranial hemorrhage, mass, midline shift, or extra-axial fluid collection is identified. The ventricles and sulci are normal. Scattered small foci of T2 hyperintensity in the cerebral white matter bilaterally are nonspecific but compatible with chronic small vessel ischemic disease. There is a chronic lacunar infarct in the white matter adjacent to the left frontal horn. Dedicated temporal lobe imaging is mildly motion degraded with symmetric volume and signal of the hippocampi. Vascular: Major intracranial vascular flow voids are preserved. Skull and upper cervical spine: No suspicious marrow lesion. Disc degeneration at C3-4 and C4-5 with suspected spinal stenosis but no gross cord compression. Sinuses/Orbits: Bilateral cataract extraction. Clear paranasal sinuses. Trace right mastoid effusion. Other: None. IMPRESSION: 1. Bilateral globus pallidus restricted diffusion which may  reflect acute injury from hypoxia, toxic exposure, or metabolic disturbance. 2. Additional scattered punctate foci of restricted diffusion in both cerebral hemispheres suggesting acute embolic infarcts. 3. Mild chronic small vessel ischemic disease. Electronically Signed   By: Sebastian Ache M.D.   On: 05/13/2018 09:25   Dg Chest Port 1 View  Result Date: 05/13/2018 CLINICAL DATA:  Recently found unresponsive EXAM: PORTABLE CHEST 1 VIEW COMPARISON:  02/08/2016 FINDINGS: Cardiac shadow is mildly prominent but stable. The lungs are well aerated bilaterally. Patchy interstitial markings are noted particularly in the apices which may be related to some underlying bronchitic markings. No sizable infiltrate or effusion is seen. No pneumothorax is noted. Degenerative change of the thoracic spine is noted IMPRESSION: Increased interstitial markings particularly in the apices. No focal confluent infiltrate is noted. Electronically Signed   By: Alcide Clever M.D.   On: 05/13/2018 06:17   Dg Ankle Left Port  Result Date: 05/13/2018 CLINICAL DATA:  Found unresponsive recently with ankle swelling EXAM: PORTABLE LEFT ANKLE - 2 VIEW COMPARISON:  None. 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 Some slight subluxation posteriorly of the talus with respect to the distal tibia is noted. Degenerative changes about the tarsal bones and ankle joint are seen with generalized soft tissue swelling. No acute fracture is noted. IMPRESSION: Chronic appearing changes without acute bony abnormality. Electronically Signed   By: Alcide Clever M.D.   On: 05/13/2018 06:19   Dg Foot 2 Views Left  Result Date: 05/13/2018 CLINICAL DATA:  Recently found unresponsive. Foot swelling is noted. EXAM: LEFT FOOT - 2 VIEW COMPARISON:  None. FINDINGS: Tarsal degenerative changes are noted. Degenerative changes at the first MTP are seen with hallux valgus deformity. Generalized foot swelling is noted. Some posterior  subluxation of the talus with respect to the distal tibia is seen. 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 No prior films are available for comparison however. 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 Some mild subluxation of the talus with respect to the distal tibia is noted. No acute fracture is seen. Electronically Signed   By: Alcide Clever M.D.   On: 05/13/2018 06:18   Dg Hip Unilat With Pelvis 2-3 Views Left  Result Date: 05/13/2018 CLINICAL DATA:  Left leg pain. EXAM: DG HIP (WITH OR WITHOUT PELVIS) 2-3V LEFT COMPARISON:  Lumbar spine 03/18/2018.  CT abdomen pelvis 11/27/2009. FINDINGS: Probe noted over the rectum. Degenerative changes lumbar spine and left hip. Total right hip replacement. Hardware  intact. A very subtle nondisplaced left femoral neck fracture cannot be excluded. No evidence of dislocation. Pelvic calcifications consistent phleboliths. Aortoiliac and peripheral vascular calcification. IMPRESSION: 1. A very subtle nondisplaced left femoral neck fracture cannot be excluded. No evidence of dislocation. 2. Degenerative changes lumbar spine and left hip. Total right hip replacement. Hardware intact. 3.  Aortoiliac and peripheral vascular disease. Electronically Signed   By: Maisie Fushomas  Register   On: 05/13/2018 06:14      Scheduled Meds: .  stroke: mapping our early stages of recovery book   Does not apply Once  . atorvastatin  80 mg Oral Daily  . diltiazem  10 mg Intravenous Once  . mouth rinse  15 mL Mouth Rinse BID  . methimazole  10 mg Oral Daily  . metoprolol succinate  100 mg Oral Daily  . pantoprazole  80 mg Oral Daily  . sodium chloride flush  3 mL Intravenous Q12H  . sodium chloride flush  3 mL Intravenous Q12H   Continuous Infusions: . sodium chloride    . heparin 1,400 Units/hr (05/14/18 0400)     LOS: 2 days    Time spent: 40 minutes   Noralee StainJennifer  Antonin Meininger, DO [facility] [email or web address] Password [email or web address]  05/14/2018, 11:28 AM

## 2018-05-15 LAB — BASIC METABOLIC PANEL
Anion gap: 16 — ABNORMAL HIGH (ref 5–15)
BUN: 11 mg/dL (ref 8–23)
CO2: 24 mmol/L (ref 22–32)
Calcium: 9.4 mg/dL (ref 8.9–10.3)
Chloride: 99 mmol/L (ref 98–111)
Creatinine, Ser: 1.05 mg/dL — ABNORMAL HIGH (ref 0.44–1.00)
GFR calc Af Amer: >60 mL/min (ref >60)
GFR calc non Af Amer: 53 mL/min — ABNORMAL LOW (ref >60)
Glucose, Bld: 115 mg/dL — ABNORMAL HIGH (ref 70–99)
Potassium: 3.6 mmol/L (ref 3.5–5.1)
Sodium: 139 mmol/L (ref 135–145)

## 2018-05-15 LAB — HEPARIN LEVEL (UNFRACTIONATED)
Heparin Unfractionated: 0.27 IU/mL — ABNORMAL LOW (ref 0.30–0.70)
Heparin Unfractionated: <0.1 IU/mL — ABNORMAL LOW (ref 0.30–0.70)

## 2018-05-15 LAB — GLUCOSE, CAPILLARY: Glucose-Capillary: 97 mg/dL (ref 70–99)

## 2018-05-15 LAB — CBC
HCT: 43.8 % (ref 36.0–46.0)
Hemoglobin: 13.3 g/dL (ref 12.0–15.0)
MCH: 27.8 pg (ref 26.0–34.0)
MCHC: 30.4 g/dL (ref 30.0–36.0)
MCV: 91.4 fL (ref 80.0–100.0)
PLATELETS: 161 10*3/uL (ref 150–400)
RBC: 4.79 MIL/uL (ref 3.87–5.11)
RDW: 11.8 % (ref 11.5–15.5)
WBC: 10.6 10*3/uL — ABNORMAL HIGH (ref 4.0–10.5)
nRBC: 0 % (ref 0.0–0.2)

## 2018-05-15 LAB — LEGIONELLA PNEUMOPHILA SEROGP 1 UR AG: L. pneumophila Serogp 1 Ur Ag: NEGATIVE

## 2018-05-15 MED ORDER — APIXABAN 5 MG PO TABS
5.0000 mg | ORAL_TABLET | Freq: Two times a day (BID) | ORAL | Status: DC
  Administered 2018-05-15 – 2018-05-17 (×5): 5 mg via ORAL
  Filled 2018-05-15 (×5): qty 1

## 2018-05-15 MED ORDER — EZETIMIBE 10 MG PO TABS
10.0000 mg | ORAL_TABLET | Freq: Every day | ORAL | Status: DC
  Administered 2018-05-15 – 2018-05-17 (×3): 10 mg via ORAL
  Filled 2018-05-15 (×3): qty 1

## 2018-05-15 NOTE — Evaluation (Signed)
Speech Language Pathology Evaluation Patient Details Name: Stephanie PebblesSara Hahn MRN: [id number] DOB: 08-12-1945 Today's Date: 05/15/2018 Time: 1030-1100 SLP Time Calculation (min) (ACUTE ONLY): 30 min  Problem List:  Patient Active Problem List   Diagnosis Date Noted  . 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 Left leg pain 05/13/2018  . Essential hypertension 05/13/2018  . CAD (coronary artery disease) 05/13/2018  . Depression 05/13/2018  . Overdose opiate, accidental or unintentional, subsequent encounter 05/12/2018  . NSTEMI (non-ST elevated myocardial infarction) (HCC) 05/12/2018  . Aspiration pneumonia (HCC) 05/12/2018  . Chronic pain 05/12/2018  . Acute encephalopathy 05/12/2018  . Seizure-like activity (HCC) 05/12/2018  . Opiate overdose (HCC) 05/12/2018   Past Medical History: History reviewed. No pertinent past medical history. Past Surgical History: History reviewed. No pertinent surgical history. HPI:  Stephanie Hahn is a 72 y.o. female with medical history significant for chronic back pain, hyperthyroidism, coronary artery disease, hypertension, and depression, who [facility] with EMS after she was found on the floor at home, poorly responsive, hypoxic with presumed opioid overdose. Head CT was negative for acute intracranial abnormality. CXR increased interstitial markings particularly in the apices. No focal confluent infiltrate is noted. Aspiration suspected while down.    Assessment / Plan / Recommendation Clinical Impression    Cognitive/linguistic and motor speech screen were completed.  The patient's speech was clear and easy to understand.  No discernible dysarthria or apraxia was noted.  Cranial nerve exam revealed adequate range of motion of the lingual, labial and facial structures.  Strength was unable to be assessed.  She achieved a score of 12/30 on the Mini Mental State Exam suggesting severe defictis.  In general  response time was very slow with the patient requiring frequent repetition of the stimulus.  She was oriented to person and place but disoriented to time and situation.  Immediate recall of 3 novel words was good but given a short delay the patient required semantic cue to facilitate recall.  Attention to task was poor and the patient required multiple redirections to task as well as repetition of the stimulus.  She would often stare off into space requiring cue to attend to task.  She was able to name objects, repeat a short sentence and read/comprehend a short sentence.  Difficulty following complex commands was observed as well as an inability to formulate a written sentence or copy a design.  Insight and judgement into her current issues was poor and the patient struggled to formulate logical solutions to simple problems.  Given repetition she was able to demonstrate appropriate use of the call bell to call the nurse.  Given the patient's deficits she will required 24x7 supervision for safety.  She will require ST follow at the next level of care to address deficits and maximize safety.  ST will follow during acute stay.      SLP Assessment  SLP Recommendation/Assessment: Patient needs continued Speech Lanaguage Pathology Services SLP Visit Diagnosis: Cognitive communication deficit (R41.841)    Follow Up Recommendations  Skilled Nursing facility    Frequency and Duration min 1 x/week  2 weeks      SLP Evaluation Cognition  Overall Cognitive Status: Impaired/Different from baseline Arousal/Alertness: Awake/alert Orientation Level: Oriented to person;Oriented to place;Disoriented to time;Disoriented to situation Attention: Focused Focused Attention: Impaired Focused Attention Impairment: Verbal basic Memory: Impaired Memory Impairment: Storage deficit;Decreased recall of new information Awareness: Impaired Awareness Impairment: Intellectual impairment Problem Solving: Impaired Problem  Solving Impairment: Verbal basic  Behaviors: Restless Safety/Judgment: Impaired       Comprehension  Reading Comprehension Reading Status: Within funtional limits    Expression Expression Primary Mode of Expression: Verbal Verbal Expression Initiation: Impaired Automatic Speech: Name;Social Response Level of Generative/Spontaneous Verbalization: Phrase;Word Repetition: No impairment Naming: No impairment Pragmatics: Impairment Impairments: Monotone;Topic maintenance;Eye contact Interfering Components: Attention Non-Verbal Means of Communication: Not applicable Written Expression Written Expression: Exceptions to [facility] Self Formulation Ability: (unable to write a short sentence)   Oral / Motor  Oral Motor/Sensory Function Overall Oral Motor/Sensory Function: Other (comment) Motor Speech Overall Motor Speech: Appears within functional limits for tasks assessed Respiration: Within functional limits Phonation: Normal Resonance: Within functional limits Articulation: Within functional limitis Intelligibility: Intelligible Motor Planning: Witnin functional limits Motor Speech Errors: Not applicable   GO                    [name] 05/15/2018, 11:16 AM

## 2018-05-15 NOTE — Progress Notes (Signed)
Physical Therapy Treatment Patient Details Name: Stephanie PebblesSara Hahn MRN: [id number] DOB: [birth year] Today's Date: 05/15/2018    History of Present Illness Pt is a 72 y.o. F with significant PMH of chronic back pain, CAD, hypertension, depression who presents after unresponsive, hypoxic event. MRI brain showing bilateral globus pallidus restricted diffusion which may reflect acute injury from hypoxia, toxic exposure or metabolic disturbance. Of note, imaging of left lower extremity without acute fracture but hip x-ray reveals very subtle nondisplaced left femoral neck fracture.     PT Comments    Pt seen for additional session at RN request [facility] and then back onto bed. When sitting, pt requiring max redirection every 5-10 seconds to prevent standing up. Minimal verbalizations but does state at one point, "your hands are cold." Requiring two person mod-maximal assistance to assist from recliner to bedside commode and then from bedside commode to bed. TotalA for hygiene and peri care task. At end of session, pt son arriving. Briefly educated pt son on pt current status and instructions to provide redirection and promote safety. Left pt with NT in room, bed alarm activated, all 4 bed rails up, and posey belt applied. Recommend one on one sitter. Continue to recommend comprehensive inpatient rehab (CIR) for post-acute therapy needs.    Follow Up Recommendations  CIR     Equipment Recommendations  Other (comment)(TBA)    Recommendations for Other Services Rehab consult     Precautions / Restrictions Precautions Precautions: Fall Precaution Comments: watch BP Restrictions Weight Bearing Restrictions: No    Mobility  Bed Mobility Overal bed mobility: Needs Assistance Bed Mobility: Sit to Supine     Supine to sit: Min assist     General bed mobility comments: Pt able to scoot hips back in bed and elevate RLE onto bed but then becomes easily distracted, requiring minA to  bring LLE onto bed  Transfers Overall transfer level: Needs assistance Equipment used: 2 person hand held assist Transfers: Sit to/from [employer] CorporationStand;Stand Pivot Transfers Sit to Stand: +2 physical assistance;Mod assist;Max assist Stand pivot transfers: Mod assist;+2 physical assistance;Max assist       General transfer comment: Pt requiring mod-maxA + 2 to perform stand pivot from bed <> BSC and BSC <> bed. NT assisted with peri care and hygiene. Demonstrates forward trunk flexion and decreased hip extension activation  Ambulation/Gait                 Stairs             Wheelchair Mobility    Modified Rankin (Stroke Patients Only) Modified Rankin (Stroke Patients Only) Pre-Morbid Rankin Score: No symptoms Modified Rankin: Severe disability     Balance Overall balance assessment: Needs assistance Sitting-balance support: Bilateral upper extremity supported;Feet supported Sitting balance-Leahy Scale: Fair                                      Cognition Arousal/Alertness: Awake/alert Behavior During Therapy: Flat affect;Impulsive;Restless Overall Cognitive Status: Impaired/Different from baseline Area of Impairment: Orientation;Attention;Memory;Following commands;Safety/judgement;Awareness                 Orientation Level: Disoriented to;Place;Time;Situation Current Attention Level: Focused Memory: Decreased short-term memory Following Commands: Follows one step commands inconsistently Safety/Judgement: Decreased awareness of deficits Awareness: Intellectual   General Comments: Pt with increased impulsivity, requiring redirection to prevent from getting out of bed/chair every 5-10 seconds  Exercises      General Comments        Pertinent Vitals/Pain Pain Assessment: Faces Faces Pain Scale: No hurt    Home Living                      Prior Function            PT Goals (current goals can now be found in the care  plan section) Acute Rehab PT Goals Patient Stated Goal: none stated; pt daughter interested in rehab PT Goal Formulation: With patient/family Time For Goal Achievement: 05/28/18 Potential to Achieve Goals: Good Progress towards PT goals: Progressing toward goals    Frequency    Min 4X/week      PT Plan Current plan remains appropriate    Co-evaluation              AM-PAC PT "6 Clicks" Mobility   Outcome Measure  Help needed turning from your back to your side while in a flat bed without using bedrails?: A Lot Help needed moving from lying on your back to sitting on the side of a flat bed without using bedrails?: A Lot Help needed moving to and from a bed to a chair (including a wheelchair)?: Total Help needed standing up from a chair using your arms (e.g., wheelchair or bedside chair)?: Total Help needed to walk in hospital room?: Total Help needed climbing 3-5 steps with a railing? : Total 6 Click Score: 8    End of Session Equipment Utilized During Treatment: Gait belt Activity Tolerance: Other (comment)(limited by decreased awareness) Patient left: in chair;with chair alarm set;Other (comment)(at nursing station, with RN present) Nurse Communication: Mobility status PT Visit Diagnosis: Other abnormalities of gait and mobility (R26.89);Other symptoms and signs involving the nervous system (R29.898)     Time: 1610-9604 PT Time Calculation (min) (ACUTE ONLY): 27 min  Charges:  $Therapeutic Activity: 23-37 mins                    Laurina Bustle, [place], DPT Acute Rehabilitation Services Pager [phone] Office [phone]    Vanetta Mulders 05/15/2018, 9:54 AM

## 2018-05-15 NOTE — Progress Notes (Signed)
Patient refusing to stay in bed, continuing to throw legs over side of bed and trying to get OOB. Safety sitter ordered for patient and she still continues to get OOB. Sitter only staying until 3:00. MD notified for something to help patient relax and follow directions. Patient refuses to speak to attendants or nurses. Ice cream and ice water given to patient. [facility]. Patient voided and refused to get back in bed. Non violent restraints ordered for patient and posy belt applied to waist while in recliner.Posey belt applied to patient for about 45 minutes. Patient moved to nurses station to sit with staff, and she sill refuses to sit still. Patient ambulated back to room with 2 person assist and she voided again in the bedside commode. This activity started around 22:00 and it is currently 4:00am. Patient refuses to lie down and rest. Patient sitting on side of bed with attendant in room. Patient placed on portable monitor.

## 2018-05-15 NOTE — Progress Notes (Signed)
STROKE TEAM PROGRESS NOTE      SUBJECTIVE (INTERVAL HISTORY) There are multiple family members at the bedside. Pt appears to be improving     OBJECTIVE Vitals:   05/15/18 0009 05/15/18 0354 05/15/18 0400 05/15/18 0900  BP:  (!) 162/119  (!) 176/90  Pulse: 72 (!) 107  100  Resp: 18 18 19 17   Temp: 99.6 F (37.6 C) 99.2 F (37.3 C)  98.5 F (36.9 C)  TempSrc: Oral Oral  Oral  SpO2: 94% 100%  92%  Weight:      Height:        CBC:  Recent Labs  Lab 05/13/18 0018 05/14/18 0158 05/15/18 0236  WBC 13.4* 9.0 10.6*  NEUTROABS 10.4*  --   --   HGB 11.4* 11.2* 13.3  HCT 38.4 37.6 43.8  MCV 96.5 95.9 91.4  PLT 147* 159 161    Basic Metabolic Panel:  Recent Labs  Lab 05/14/18 0158 05/15/18 0236  NA 144 139  K 3.9 3.6  CL 106 99  CO2 29 24  GLUCOSE 118* 115*  BUN 16 11  CREATININE 1.10* 1.05*  CALCIUM 8.9 9.4    Lipid Panel:     Component Value Date/Time   CHOL 153 05/14/2018 0158   TRIG 124 05/14/2018 0158   HDL 43 05/14/2018 0158   CHOLHDL 3.6 05/14/2018 0158   VLDL 25 05/14/2018 0158   LDLCALC 85 05/14/2018 0158   HgbA1c:  Lab Results  Component Value Date   HGBA1C 4.4 (L) 05/14/2018   Urine Drug Screen:     Component Value Date/Time   LABOPIA POSITIVE (A) 05/13/2018 0047   COCAINSCRNUR NONE DETECTED 05/13/2018 0047   LABBENZ POSITIVE (A) 05/13/2018 0047   AMPHETMU NONE DETECTED 05/13/2018 0047   THCU NONE DETECTED 05/13/2018 0047   LABBARB NONE DETECTED 05/13/2018 0047    Alcohol Level No results found for: ETH  IMAGING   Ct Head Wo Contrast 05/13/2018 IMPRESSION:  Negative head CT.  No acute intracranial abnormality identified.    Mr Brain Wo Contrast 05/13/2018 IMPRESSION:  1. Bilateral globus pallidus restricted diffusion which may reflect acute injury from hypoxia, toxic exposure, or metabolic disturbance.  2. Additional scattered punctate foci of restricted diffusion in both cerebral hemispheres suggesting acute embolic infarcts.   3. Mild chronic small vessel ischemic disease.     Dg Chest Port 1 View 05/13/2018 IMPRESSION:  Increased interstitial markings particularly in the apices. No focal confluent infiltrate is noted.   Dg Ankle Left Port 05/13/2018 IMPRESSION:  Chronic appearing changes without acute bony abnormality.    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 Some mild subluxation of the talus with respect to the distal tibia is noted. No acute fracture is seen.    Dg Hip Unilat With Pelvis 2-3 Views Left 05/13/2018  IMPRESSION:  1. A very subtle nondisplaced left femoral neck fracture cannot be excluded. No evidence of dislocation. 2. Degenerative changes lumbar spine and left hip. Total right hip replacement. Hardware intact.  3.  Aortoiliac and peripheral vascular disease.     Transthoracic Echocardiogram  05/14/2018 Study Conclusions - Left ventricle: The cavity size was normal. Wall thickness was   increased in a pattern of mild to moderate LVH. Systolic function   was normal. The estimated ejection fraction was 65%. Wall motion   was normal; there were no regional wall motion abnormalities.   Features are consistent with  a pseudonormal left ventricular   filling pattern, with concomitant abnormal relaxation and   increased filling pressure (grade 2 diastolic dysfunction).   Doppler parameters are consistent with high ventricular filling   pressure. - Aortic valve: Mildly calcified annulus. Trileaflet. - Mitral valve: Mildly calcified annulus. Normal thickness leaflets   . There was mild regurgitation. Valve area by pressure half-time:   1.62 cm^2. - Atrial septum: No defect or patent foramen ovale was identified. - Tricuspid valve: There was mild regurgitation. - Pulmonary arteries: PA peak pressure: 33 mm Hg (S).    Bilateral Carotid Dopplers  05/14/2018 Summary: Right  Carotid: Velocities in the right ICA are consistent with a 1-39% stenosis.        Upper end of range. Left Carotid: Velocities in the left ICA are consistent with a 40-59% stenosis.       Mid to upper end of range. Vertebrals: Bilateral vertebral arteries demonstrate antegrade flow.    PHYSICAL EXAM Blood pressure (!) 176/90, pulse 100, temperature 98.5 F (36.9 C), temperature source Oral, resp. rate 17, height 5\' 4"  (1.626 m), weight 93.7 kg, SpO2 92 %. Pleasant elderly African American lady sitting up comfortably in a chair not in distress. . Afebrile. Head is nontraumatic. Neck is supple without bruit.    Cardiac exam no murmur or gallop. Lungs are clear to auscultation. Distal pulses are well felt.  Neurological Exam ;  Awake alert  fluent speech and answeres simple questions and speaks short sentences. Oriented to place and time. Diminished attention, registration and recall. No dysarthria extraocular moments are full range without nystagmus. Blinks to threat bilaterally. Fundi not visualized. Face is symmetric. Tongue midline. Motor system exam shows symmetric upper and lower extremity strength with mild symmetric bilateral lower extremity weakness. Sensation appears preserved. Gait not tested.   ASSESSMENT/PLAN Ms. Gerre PebblesSara Wassel is a 72 y.o. female with history of chronic hypertension, coronary artery disease, chronic back pain on narcotics found down at the floor poorly responsive and hypoxic with seizure-like activity that resolved with Versed.  She did not receive IV t-PA due to  Unknown time of onset.  Stroke:  Bilateral globus pallidus infarcts - likely from hypoxic injury but she also has a right parietal embolic infarcts from new onset atrial fibrillation    Resultant  Altered mental status which appears to be improving possibly from hypoxic encephalopathy.  CT head - Negative  MRI head - Bilateral globus pallidus restricted diffusion which may reflect acute  injury from hypoxia, toxic exposure, or metabolic disturbance. Additional scattered punctate foci of restricted diffusion in both cerebral hemispheres suggesting acute embolic infarcts.   MRA head - not ordered  CTA H&N - not ordered  Carotid Doppler - Lt ICA - 40-59% stenosis.  2D Echo - EF 65%. No cardiac source of emboli identified.   EEG - pending  LDL - 85  HgbA1c - 4.4  UDS - opiates and benzodiazepines  VTE prophylaxis - IV heparin (for elevated troponin)  Diet - regular  No antithrombotic prior to admission, now on heparin IV  Patient counseled to be compliant with her antithrombotic medications  Ongoing aggressive stroke risk factor management  Therapy recommendations:  CIR recommended  Disposition:  Pending  Hypertension  Stable . Permissive hypertension (OK if < 220/120) but gradually normalize in 5-7 days . Long-term BP goal normotensive  Hyperlipidemia  Lipid lowering medication PTA: Lipitor 80 mg daily  LDL 85, goal < 70  Current lipid lowering medication: Lipitor 80 mg daily -  consider adding Zetia  Continue statin at discharge  Diabetes  HgbA1c 4.4, goal < 7.0  Controlled  Other Stroke Risk Factors  Advanced age  Obesity, Body mass index is 35.46 kg/m., recommend weight loss, diet and exercise as appropriate   Coronary artery disease   Other Active Problems  Seizure like activity PTA -> EEG  Elevated creatinine - improving  Plan  Check EEG   Change  Heparin drip to eliquis when able to swallow safely  Hospital day # 3    She presented with possibly narcotic overdose and MRI scan shows symmetric global pallidus hyperintensities but she also has new-onset A. Fib and embolic right parietal strokes so a mixed picture. She will need long-term anticoagulation when able to swallow safely.  . Check EEG for seizures. Recommend change iv heparin to eliquis. Expect gradually improvement in memory and gradual status.Long discussion  with the patient and  Family at  the bedside and answered questions.greater than 50% time during this 25 minute visit was spent on counseling and coordination of care about her strokes, atrial fibrillation and discussion about stroke prevention and treatment.d/w  Dr. Alvino Chapel. Stroke team will sign off. Kindly call for questions.  Delia Heady, MD Medical Director [facility] Pager: [phone] 05/15/2018 12:14 PM   To contact Stroke Continuity provider, please refer to [email or web address]. After hours, contact General Neurology

## 2018-05-15 NOTE — Progress Notes (Addendum)
PROGRESS NOTE    Stephanie Hahn  WUJ:[id number] DOB: February 26, 1946 DOA: 05/12/2018 PCP: Barron AlvineGibson, David, MD     Brief Narrative:  Stephanie Hahn is a 72 y.o. female with medical history significant for chronic back pain, hyperthyroidism, coronary artery disease, hypertension, and depression, who presented to the [facility] emergency department with EMS after she was found on the floor at home, poorly responsive and hypoxic.  Family is at the bedside and assist with the history.  It had been approximately 24 hours since the family member had spoken with her on the phone, she did not return calls on the day of presentation, and when family went to check on her, she was found on the floor poorly responsive.  She was saturating in the 50s on EMS arrival with poor respiratory effort and poor responsiveness.  She responded to Narcan, had seizure-like activity that resolved with Versed, and was brought into the ED. Upon arrival to the ED, patient is found to be obtunded with poor respiratory effort, again responding well to Narcan, afebrile, tachycardic, with stable blood pressure, and saturating adequately on room air after Narcan administration.  EKG features sinus tachycardia with rate 113 and PVCs.  Noncontrast head CT was negative for acute intracranial abnormality.  Chest x-ray is notable for cardiomegaly, peribronchial thickening, and interstitial prominence, possibly suggestive of early interstitial edema.  Chemistry panel is notable for a creatinine of 2.40, up from a baseline of 1.0.  Ethanol level is undetectable and TSH was normal.  CBC features a leukocytosis to 17,100 with normal H&H and normal platelets.  Lactic acid was reassuringly normal, INR normal and urinalysis unremarkable.  Troponin was elevated at 1.78, increased further to 3.0, and then 3.7 while in the ED.  Patient received Narcan multiple times, Versed, 1 L of normal saline, duo nebs, cefepime, vancomycin, and Flagyl in the ED.  She was also given  125 mg of IV Solu-Medrol and started on a heparin infusion.  She has been transferred to stepdown [facility] for further evaluation and management.  New events last 24 hours / Subjective: Patient with confusion, impulsiveness, agitation overnight requiring sitter. On exam this morning, she is laying in bed with son at bedside. Appears comfortable, not oriented to place or year, follows some commands.   Assessment & Plan:   Principal Problem:   Overdose opiate, accidental or unintentional, subsequent encounter Active Problems:   NSTEMI (non-ST elevated myocardial infarction) (HCC)   Aspiration pneumonia (HCC)   Chronic pain   Acute encephalopathy   Seizure-like activity (HCC)   Opiate overdose (HCC)   Left leg pain   Essential hypertension   CAD (coronary artery disease)   Cerebral embolism with cerebral infarction   Acute metabolic encephalopathy Differential diagnosis with opioid overdose, as her encephalopathy improved with Narcan administration. UDS positive for opiates and benzo  CT head negative  MRI brain: Bilateral globus pallidus restricted diffusion which may reflect acute injury from hypoxia, toxic exposure, or metabolic disturbance EEG: The patient is comatose and not responding, throughout the recording bilateral sharp waves were noted that built up at times and became rhythmic but no ictal phase was noticed in the recording. Diffuse triphasic waves were also noted that might represent severe encephalopathy. Neurology following  Embolic CVA Neurology following Echo - EF 65%, grade 2 dd, no patent foramen ovale  Carotid US - unremarkable  PT OT SLP - rec CIR, consult placed   A Fib RVR Now normal sinus rhythm Continue toprol  Ordered eliquis  for [facility]   Acute hypoxic respiratory failure Improved with Narcan Chest x-ray reviewed independently, no focal consolidation noted.  Stop Unasyn Now on room air  NSTEMI type II Likely demand ischemia in setting of hypoxic  event Troponin peaked at 3.7 and trended downward to 1.56  EKG reviewed independently which revealed normal sinus rhythm without ST change  Patient denies any chest pain Echocardiogram without wall motion abnormalities   Seizure-like activity Episode of seizure-like activity in the emergency department that resolved with Versed Continue seizure precaution EEG: The patient is comatose and not responding, throughout the recording bilateral sharp waves were noted that built up at times and became rhythmic but no ictal phase was noticed in the recording. Diffuse triphasic waves were also noted that might represent severe encephalopathy.  Acute kidney injury Baseline creatinine 1 Stable back to baseline   Hyperthyroidism Continue Tapazole, metoprolol  HLD Continue lipitor   Chronic pain Patient has chronic back pain managed at home with Percocet No pain currently  Acute left leg pain No pain on my physical exam this morning Imaging of the left lower extremity without acute fracture, although hip x-ray reveals very subtle nondisplaced left femoral neck fracture cannot be excluded. May pursue CT scan if patient has any complaints of leg pain   DVT prophylaxis: IV Heparin --> Eliquis  Code Status: Discussed with son at bedside  Family Communication: Full  Disposition Plan: CIR eval pending    Consultants:   Neurology  Procedures:   None   Antimicrobials:  Anti-infectives (From admission, onward)   Start     Dose/Rate Route Frequency Ordered Stop   05/13/18 0600  Ampicillin-Sulbactam (UNASYN) 3 g in sodium chloride 0.9 % 100 mL IVPB  Status:  Discontinued     3 g 200 mL/hr over 30 Minutes Intravenous Every 8 hours 05/13/18 0146 05/13/18 1240       Objective: Vitals:   05/15/18 0009 05/15/18 0354 05/15/18 0400 05/15/18 0900  BP:  (!) 162/119  (!) 176/90  Pulse: 72 (!) 107  100  Resp: 18 18 19 17   Temp: 99.6 F (37.6 C) 99.2 F (37.3 C)  98.5 F (36.9 C)  TempSrc:  Oral Oral  Oral  SpO2: 94% 100%  92%  Weight:      Height:        Intake/Output Summary (Last 24 hours) at 05/15/2018 1222 Last data filed at 05/15/2018 0900 Gross per 24 hour  Intake 528 ml  Output 2000 ml  Net -1472 ml   Filed Weights   05/12/18 2308 05/13/18 0534 05/14/18 0500  Weight: 95.4 kg 96.5 kg 93.7 kg     Examination: General exam: Appears calm and comfortable  Respiratory system: Clear to auscultation. Respiratory effort normal. Cardiovascular system: S1 & S2 heard, RRR. No JVD, murmurs, rubs, gallops or clicks. No pedal edema. Gastrointestinal system: Abdomen is nondistended, soft and nontender. No organomegaly or masses felt. Normal bowel sounds heard. Central nervous system: Alert but not oriented to place or year. Does follow some commands but not all. Strength equal  Extremities: Symmetric Skin: No rashes, lesions or ulcers   Data Reviewed: I have personally reviewed following labs and imaging studies  CBC: Recent Labs  Lab 05/13/18 0018 05/14/18 0158 05/15/18 0236  WBC 13.4* 9.0 10.6*  NEUTROABS 10.4*  --   --   HGB 11.4* 11.2* 13.3  HCT 38.4 37.6 43.8  MCV 96.5 95.9 91.4  PLT 147* 159 161   Basic Metabolic Panel: Recent Labs  Lab 05/13/18 0018 05/14/18 0158 05/15/18 0236  NA 141 144 139  K 4.2 3.9 3.6  CL 104 106 99  CO2 26 29 24   GLUCOSE 110* 118* 115*  BUN 28* 16 11  CREATININE 1.76* 1.10* 1.05*  CALCIUM 8.6* 8.9 9.4   GFR: Estimated Creatinine Clearance: 53.7 mL/min (A) (by C-G formula based on SCr of 1.05 mg/dL (H)). Liver Function Tests: Recent Labs  Lab 05/13/18 0018  AST 32  ALT 20  ALKPHOS 76  BILITOT 0.7  PROT 6.5  ALBUMIN 3.3*   No results for input(s): LIPASE, AMYLASE in the last 168 hours. Recent Labs  Lab 05/13/18 0018  AMMONIA 40*   Coagulation Profile: No results for input(s): INR, PROTIME in the last 168 hours. Cardiac Enzymes: Recent Labs  Lab 05/13/18 0018 05/13/18 0559 05/13/18 1142 05/14/18 0158   CKTOTAL 240*  --   --   --   TROPONINI 3.10* 2.46* 1.58* 1.56*   BNP (last 3 results) No results for input(s): PROBNP in the last 8760 hours. HbA1C: Recent Labs    05/14/18 0158  HGBA1C 4.4*   CBG: Recent Labs  Lab 05/13/18 0931 05/14/18 0803 05/15/18 0828  GLUCAP 105* 111* 97   Lipid Profile: Recent Labs    05/14/18 0158  CHOL 153  HDL 43  LDLCALC 85  TRIG 124  CHOLHDL 3.6   Thyroid Function Tests: No results for input(s): TSH, T4TOTAL, FREET4, T3FREE, THYROIDAB in the last 72 hours. Anemia Panel: Recent Labs    05/13/18 0018  VITAMINB12 159*   Sepsis Labs: No results for input(s): PROCALCITON, LATICACIDVEN in the last 168 hours.  Recent Results (from the past 240 hour(s))  MRSA PCR Screening     Status: None   Collection Time: 05/12/18  9:54 PM  Result Value Ref Range Status   MRSA by PCR NEGATIVE NEGATIVE Final    Comment:        The GeneXpert MRSA Assay (FDA approved for NASAL specimens only), is one component of a comprehensive MRSA colonization surveillance program. It is not intended to diagnose MRSA infection nor to guide or monitor treatment for MRSA infections. Performed at [facility], [address], [place], [place] [address]        Radiology Studies: Vas US Carotid (at [facility] Only)  Result Date: 05/14/2018 Carotid Arterial Duplex Study Indications: CVA. Limitations: patient movement Performing Technologist: Farrel Demark RDMS, RVT  Examination Guidelines: A complete evaluation includes B-mode imaging, spectral Doppler, color Doppler, and power Doppler as needed of all accessible portions of each vessel. Bilateral testing is considered an integral part of a complete examination. Limited examinations for reoccurring indications may be performed as noted.  Right Carotid Findings: +----------+--------+--------+--------+-------------------------+--------+           PSV cm/sEDV cm/sStenosisDescribe                 Comments  +----------+--------+--------+--------+-------------------------+--------+ CCA Prox  75      14              heterogenous                      +----------+--------+--------+--------+-------------------------+--------+ CCA Distal98      19                                                +----------+--------+--------+--------+-------------------------+--------+  ICA Prox  118     32      1-39%   heterogenous and calcific         +----------+--------+--------+--------+-------------------------+--------+ ICA Distal49      15                                                +----------+--------+--------+--------+-------------------------+--------+ ECA       121     9                                                 +----------+--------+--------+--------+-------------------------+--------+ +----------+--------+-------+----------------+-------------------+           PSV cm/sEDV cmsDescribe        Arm Pressure (mmHG) +----------+--------+-------+----------------+-------------------+ [id number]            Multiphasic, WNL                    +----------+--------+-------+----------------+-------------------+ +---------+--------+--+--------+-+---------+ VertebralPSV cm/s24EDV cm/s7Antegrade +---------+--------+--+--------+-+---------+  Left Carotid Findings: +----------+--------+--------+--------+-------------------------+--------+           PSV cm/sEDV cm/sStenosisDescribe                 Comments +----------+--------+--------+--------+-------------------------+--------+ CCA Prox  93      22              heterogenous                      +----------+--------+--------+--------+-------------------------+--------+ CCA Distal82      16                                                +----------+--------+--------+--------+-------------------------+--------+ ICA Prox  187     56      40-59%  heterogenous and calcific          +----------+--------+--------+--------+-------------------------+--------+ ICA Mid   150     32                                                +----------+--------+--------+--------+-------------------------+--------+ ICA Distal63      16                                                +----------+--------+--------+--------+-------------------------+--------+ ECA       77      12                                                +----------+--------+--------+--------+-------------------------+--------+ +----------+--------+--------+----------------+-------------------+ SubclavianPSV cm/sEDV cm/sDescribe        Arm Pressure (mmHG) +----------+--------+--------+----------------+-------------------+           141             Multiphasic, WNL                    +----------+--------+--------+----------------+-------------------+ +---------+--------+--+--------+--+---------+  VertebralPSV cm/s59EDV cm/s14Antegrade +---------+--------+--+--------+--+---------+  Summary: Right Carotid: Velocities in the right ICA are consistent with a 1-39% stenosis.                Upper end of range. Left Carotid: Velocities in the left ICA are consistent with a 40-59% stenosis.               Mid to upper end of range. Vertebrals: Bilateral vertebral arteries demonstrate antegrade flow. *See table(s) above for measurements and observations.  Electronically signed by Delia Heady MD on 05/14/2018 at 1:54:23 PM.    Final       Scheduled Meds: .  stroke: mapping our early stages of recovery book   Does not apply Once  . atorvastatin  80 mg Oral Daily  . mouth rinse  15 mL Mouth Rinse BID  . methimazole  10 mg Oral Daily  . metoprolol succinate  100 mg Oral Daily  . pantoprazole  80 mg Oral Daily  . sodium chloride flush  3 mL Intravenous Q12H  . sodium chloride flush  3 mL Intravenous Q12H   Continuous Infusions: . sodium chloride    . heparin 1,400 Units/hr (05/15/18 0400)     LOS: 3 days     Time spent: 25 minutes   Noralee Stain, DO [facility] [email or web address] Password [facility]  05/15/2018, 12:22 PM

## 2018-05-15 NOTE — Progress Notes (Addendum)
ANTICOAGULATION CONSULT NOTE   Pharmacy Consult for Heparin  Indication: chest pain/ACS, atrial fibrillation and stroke  No Known Allergies  Patient Measurements: Height: 5\' 4"  (162.6 cm) Weight: 206 lb 9.1 oz (93.7 kg) IBW/kg (Calculated) : 54.7  Vital Signs: Temp: 98.5 F (36.9 C) (12/08 0900) Temp Source: Oral (12/08 0900) BP: 176/90 (12/08 0900) Pulse Rate: 100 (12/08 0900)  Labs: Recent Labs    05/13/18 0018 05/13/18 0559 05/13/18 1142  05/14/18 0158 05/14/18 1142 05/15/18 0236  HGB 11.4*  --   --   --  11.2*  --  13.3  HCT 38.4  --   --   --  37.6  --  43.8  PLT 147*  --   --   --  159  --  161  HEPARINUNFRC  --  0.29*  --    < > 0.32 0.42 0.27*  CREATININE 1.76*  --   --   --  1.10*  --  1.05*  CKTOTAL 240*  --   --   --   --   --   --   TROPONINI 3.10* 2.46* 1.58*  --  1.56*  --   --    < > = values in this interval not displayed.    Estimated Creatinine Clearance: 53.7 mL/min (A) (by C-G formula based on SCr of 1.05 mg/dL (H)).  Assessment: 72 y/o F transfer from outside hospital for presumed opioid overdose, transferred on heparin drip for elevated troponin (3.7), also with afib and embolic right parietal strokes.   Heparin level is just below goal at 0.27 but 3rd shift RN reported patient was frequently bending her arm and may not have been infusing well. No bleeding or complications noted.  CBC stable.  Goal of Therapy:  Heparin level 0.3-0.5 units/mL, acute ischemic stroke Monitor platelets by anticoagulation protocol: Yes   Plan:  -Continue heparin drip at 1400 units/hr -Recheck heparin level for today -Daily heparin level and CBC -F/u plans for oral anticoagulation eventually   Stephanie Hahn, PharmD, BCPS Clinical Pharmacist Clinical phone for 05/15/2018 until 3p is [phone] 05/15/2018 11:46 AM  **Pharmacist phone directory can now be found on [email or web address] listed under [facility]**   Addendum: Consulted to transition to apixaban. She is <80  y/o, weight >60 kg, and SCr <1.5.  Discontinue heparin drip Begin apixaban 5 mg PO bid Education prior to discharge Pharmacy signing off consult but will continue to follow peripherally  Stephanie BackJennifer , PharmD, BCPS Clinical Pharmacist 05/15/2018 1:03 PM

## 2018-05-15 NOTE — Progress Notes (Signed)
Physical Therapy Treatment Patient Details Name: Stephanie Hahn MRN: [id number] DOB: 23-Apr-1946 Today's Date: 05/15/2018    History of Present Illness Pt is a 72 y.o. F with significant PMH of chronic back pain, CAD, hypertension, depression who presents after unresponsive, hypoxic event. MRI brain showing bilateral globus pallidus restricted diffusion which may reflect acute injury from hypoxia, toxic exposure or metabolic disturbance. Of note, imaging of left lower extremity without acute fracture but hip x-ray reveals very subtle nondisplaced left femoral neck fracture.     PT Comments    Patient seen to assist RN. Upon arrival, pt throwing both legs over the bed rail and attempting to exit bed. Pt requiring two person moderate assistance to perform stand pivot transfer from bed to chair. Once in recliner at nursing station, pt tips recliner anteriorly, and in low squat position with feet on floor; pt assisted to standing with RN and then returned back to chair. Presents as a very high fall risk and recommend one on one sitter.    Follow Up Recommendations  CIR     Equipment Recommendations  Other (comment)(TBA)    Recommendations for Other Services Rehab consult     Precautions / Restrictions Precautions Precautions: Fall Precaution Comments: watch BP Restrictions Weight Bearing Restrictions: No    Mobility  Bed Mobility Overal bed mobility: Needs Assistance Bed Mobility: Supine to Sit     Supine to sit: Min guard     General bed mobility comments: Pt sitting up without physical assistance and throwing legs off of bed rail  Transfers Overall transfer level: Needs assistance Equipment used: 2 person hand held assist Transfers: Sit to/from BJ'sStand;Stand Pivot Transfers Sit to Stand: +2 physical assistance;Mod assist Stand pivot transfers: Mod assist;+2 physical assistance       General transfer comment: Pt requiring modA + 2 to stand and transfer from bed to chair.    Ambulation/Gait                 Stairs             Wheelchair Mobility    Modified Rankin (Stroke Patients Only) Modified Rankin (Stroke Patients Only) Pre-Morbid Rankin Score: No symptoms Modified Rankin: Severe disability     Balance Overall balance assessment: Needs assistance Sitting-balance support: Bilateral upper extremity supported;Feet supported Sitting balance-Leahy Scale: Fair                                      Cognition Arousal/Alertness: Awake/alert Behavior During Therapy: Flat affect;Impulsive;Restless Overall Cognitive Status: Impaired/Different from baseline Area of Impairment: Orientation;Attention;Memory;Following commands;Safety/judgement;Awareness                 Orientation Level: Disoriented to;Place;Time;Situation Current Attention Level: Focused Memory: Decreased short-term memory Following Commands: Follows one step commands inconsistently Safety/Judgement: Decreased awareness of deficits Awareness: Intellectual   General Comments: Pt with increased impulsivity, requiring redirection to prevent from getting out of bed/chair every 5-10 seconds      Exercises      General Comments        Pertinent Vitals/Pain Pain Assessment: Faces Faces Pain Scale: No hurt    Home Living                      Prior Function            PT Goals (current goals can now be found in the care plan section) Acute  Rehab PT Goals Patient Stated Goal: none stated; pt daughter interested in rehab PT Goal Formulation: With patient/family Time For Goal Achievement: 05/28/18 Potential to Achieve Goals: Good Progress towards PT goals: Progressing toward goals    Frequency    Min 4X/week      PT Plan Current plan remains appropriate    Co-evaluation              AM-PAC PT "6 Clicks" Mobility   Outcome Measure  Help needed turning from your back to your side while in a flat bed without using  bedrails?: A Lot Help needed moving from lying on your back to sitting on the side of a flat bed without using bedrails?: A Lot Help needed moving to and from a bed to a chair (including a wheelchair)?: Total Help needed standing up from a chair using your arms (e.g., wheelchair or bedside chair)?: Total Help needed to walk in hospital room?: Total Help needed climbing 3-5 steps with a railing? : Total 6 Click Score: 8    End of Session Equipment Utilized During Treatment: Gait belt Activity Tolerance: Other (comment)(limited by decreased awareness) Patient left: in chair;with chair alarm set;Other (comment)(at nursing station, with RN present) Nurse Communication: Mobility status PT Visit Diagnosis: Other abnormalities of gait and mobility (R26.89);Other symptoms and signs involving the nervous system (A54.098)     Time: 1191-4782 PT Time Calculation (min) (ACUTE ONLY): 10 min  Charges:  $Therapeutic Activity: 8-22 mins                    Laurina Bustle, PT, DPT Acute Rehabilitation Services Pager [phone] Office [phone]   Vanetta Mulders 05/15/2018, 9:48 AM

## 2018-05-15 NOTE — Evaluation (Signed)
Occupational Therapy Evaluation Patient Details Name: Stephanie Hahn MRN: [id number] DOB: [birth year] Today's Date: 05/15/2018    History of Present Illness Pt is a 72 y.o. F with significant PMH of chronic back pain, CAD, hypertension, depression who presents after unresponsive, hypoxic event. MRI brain showing bilateral globus pallidus restricted diffusion which may reflect acute injury from hypoxia, toxic exposure or metabolic disturbance. Of note, imaging of left lower extremity without acute fracture but hip x-ray reveals very subtle nondisplaced left femoral neck fracture.    Clinical Impression   PTA, pt was living with her brother and was independent. Pt currently requiring Min A for UB ADLs, Mod A for LB ADLs, Mod-Max A for toileting, and Mod A+2 for functional transfers. Pt presenting with poor cognition as seen by decreased attention, awareness, problem solving, processing, and memory significantly impacting her functional performance. Pt will require from further acute OT to facilitate safe dc. Recommend dc to CIR for intensive OT to optimize safety, independence with ADLs, and return to PLOF.      Follow Up Recommendations  CIR;Supervision/Assistance - 24 hour    Equipment Recommendations  Other (comment)(Defer to next venue)    Recommendations for Other Services Rehab consult;PT consult;Speech consult     Precautions / Restrictions Precautions Precautions: Fall Precaution Comments: watch BP Restrictions Weight Bearing Restrictions: No      Mobility Bed Mobility Overal bed mobility: Needs Assistance Bed Mobility: Sit to Supine;Supine to Sit     Supine to sit: Min guard;HOB elevated Sit to supine: Min assist   General bed mobility comments: Min A for managing BLEs. Poor attention  Transfers Overall transfer level: Needs assistance Equipment used: 2 person hand held assist Transfers: Sit to/from [employer]Stand;Stand Pivot Transfers Sit to Stand: +2 physical assistance;Mod  assist Stand pivot transfers: Mod assist;+2 physical assistance       General transfer comment: Mod A +2 for power up into standing and then Mod A +2 for pivot to [facility]. Pt with poor awareness and requring increased cues for hand placement    Balance Overall balance assessment: Needs assistance Sitting-balance support: Bilateral upper extremity supported;Feet supported Sitting balance-Leahy Scale: Fair     Standing balance support: No upper extremity supported;During functional activity Standing balance-Leahy Scale: Poor Standing balance comment: Requiring physical A                           ADL either performed or assessed with clinical judgement   ADL Overall ADL's : Needs assistance/impaired Eating/Feeding: Set up;Supervision/ safety;Sitting   Grooming: Set up;Supervision/safety;Bed level   Upper Body Bathing: Minimal assistance;Sitting   Lower Body Bathing: Moderate assistance;Sit to/from stand   Upper Body Dressing : Minimal assistance;Sitting   Lower Body Dressing: Moderate assistance;Sit to/from stand Lower Body Dressing Details (indicate cue type and reason): Pt able to bring left ankles to R knees for doffing sock and brings right leg to EOB. Pt requiring Mod A for sit<>stand and safety Toilet Transfer: Moderate assistance;+2 for physical assistance;Stand-pivot;BSC Toilet Transfer Details (indicate cue type and reason): Mod A+2 for sit<>Stand then [facility] Toileting- Clothing Manipulation and Hygiene: Maximal assistance;Sit to/from stand Toileting - Clothing Manipulation Details (indicate cue type and reason): Pt with difficulty sequencing      Functional mobility during ADLs: Moderate assistance;+2 for physical assistance General ADL Comments: Pt with decreased cognition, balance, and activity tolerance     Vision   Additional Comments: Will further assess     Perception  Praxis      Pertinent Vitals/Pain Pain Assessment: Faces Faces  Pain Scale: No hurt Pain Intervention(s): Monitored during session     Hand Dominance Right   Extremity/Trunk Assessment Upper Extremity Assessment Upper Extremity Assessment: Generalized weakness;RUE deficits/detail RUE Deficits / Details: Noting slight decreased strength at right UE    Lower Extremity Assessment Lower Extremity Assessment: Defer to PT evaluation LLE Deficits / Details: 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    Cervical / Trunk Assessment Cervical / Trunk Assessment: Other exceptions Cervical / Trunk Exceptions: Increased body habitus   Communication Communication Communication: Other (comment)(minimal verbalization)   Cognition Arousal/Alertness: Awake/alert Behavior During Therapy: Flat affect;Impulsive Overall Cognitive Status: Impaired/Different from baseline Area of Impairment: Orientation;Attention;Memory;Following commands;Safety/judgement;Awareness;Problem solving                 Orientation Level: Disoriented to;Situation Current Attention Level: Focused Memory: Decreased short-term memory Following Commands: Follows one step commands inconsistently Safety/Judgement: Decreased awareness of deficits Awareness: Intellectual Problem Solving: Slow processing;Difficulty sequencing;Decreased initiation;Requires verbal cues General Comments: Pt able to state she was at "[place]" but unable to state why. Also reporting her full name, DOB, and current month. Pt with poor problem solving and pcoessing requiring increased cues and time throughout session. During toielting, pt unable to problem solve how to perform toilet hygiene and required increased assistance. Pt with increased impulsivity, requiring redirection to prevent from getting out of bed/chair every 5-10 seconds   General Comments  VSS. Daughter present throughout session    Exercises     Shoulder Instructions      Home Living Family/patient expects to be  discharged to:: Private residence Living Arrangements: Other relatives(Brother) Available Help at Discharge: Family;Available PRN/intermittently Type of Home: House Home Access: Stairs to enter [employer] of Steps: 2 Entrance Stairs-Rails: None Home Layout: One level         Firefighter: Standard     Home Equipment: None      Lives With: Other (Comment)(brother)    Prior Functioning/Environment Level of Independence: Independent        Comments: ADLs, IADLs, and driving        OT Problem List: Decreased strength;Decreased range of motion;Decreased activity tolerance;Impaired balance (sitting and/or standing);Decreased cognition;Decreased safety awareness;Decreased knowledge of use of DME or AE;Decreased knowledge of precautions;Obesity      OT Treatment/Interventions: Self-care/ADL training;Therapeutic exercise;Energy conservation;DME and/or AE instruction;Therapeutic activities;Cognitive remediation/compensation;Patient/family education    OT Goals(Current goals can be found in the care plan section) Acute Rehab OT Goals Patient Stated Goal: none stated; pt daughter interested in rehab OT Goal Formulation: With patient/family Time For Goal Achievement: 05/29/18 Potential to Achieve Goals: Good ADL Goals Pt Will Perform Grooming: with min assist;standing Pt Will Perform Upper Body Dressing: with set-up;with supervision;sitting Pt Will Perform Lower Body Dressing: with min assist;sit to/from stand Pt Will Transfer to Toilet: with set-up;with supervision;ambulating;bedside commode Pt Will Perform Toileting - Clothing Manipulation and hygiene: with set-up;with supervision;sit to/from stand;sitting/lateral leans Additional ADL Goal #1: Pt will attend to ADL task with Min cues  OT Frequency: Min 2X/week   Barriers to D/C:            Co-evaluation              AM-PAC OT "6 Clicks" Daily Activity     Outcome Measure Help from another person  eating meals?: A Little Help from another person taking care of personal grooming?: A Little Help from another person toileting,  which includes using toliet, bedpan, or urinal?: A Lot Help from another person bathing (including washing, rinsing, drying)?: A Lot Help from another person to put on and taking off regular upper body clothing?: A Little Help from another person to put on and taking off regular lower body clothing?: A Lot 6 Click Score: 15   End of Session Equipment Utilized During Treatment: Gait belt Nurse Communication: Mobility status  Activity Tolerance: Patient tolerated treatment well Patient left: in bed;with call bell/phone within reach;with bed alarm set;with nursing/sitter in room;with family/visitor present;with restraints reapplied  OT Visit Diagnosis: Unsteadiness on feet (R26.81);Other abnormalities of gait and mobility (R26.89);Muscle weakness (generalized) (M62.81);Other symptoms and signs involving cognitive function                Time: 1610-9604 OT Time Calculation (min): 20 min Charges:  OT General Charges $OT Visit: 1 Visit OT Evaluation $OT Eval Moderate Complexity: 1 Mod  Jadian Karman MSOT, OTR/L Acute Rehab Pager: [phone] Office: [phone]  Theodoro Grist Ardell Aaronson 05/15/2018, 5:27 PM

## 2018-05-16 ENCOUNTER — Other Ambulatory Visit: Payer: Self-pay

## 2018-05-16 DIAGNOSIS — I169 Hypertensive crisis, unspecified: Secondary | ICD-10-CM

## 2018-05-16 DIAGNOSIS — E876 Hypokalemia: Secondary | ICD-10-CM

## 2018-05-16 DIAGNOSIS — N179 Acute kidney failure, unspecified: Secondary | ICD-10-CM

## 2018-05-16 DIAGNOSIS — I4891 Unspecified atrial fibrillation: Secondary | ICD-10-CM

## 2018-05-16 LAB — BASIC METABOLIC PANEL
Anion gap: 10 (ref 5–15)
BUN: 12 mg/dL (ref 8–23)
CO2: 30 mmol/L (ref 22–32)
Calcium: 8.8 mg/dL — ABNORMAL LOW (ref 8.9–10.3)
Chloride: 99 mmol/L (ref 98–111)
Creatinine, Ser: 1.09 mg/dL — ABNORMAL HIGH (ref 0.44–1.00)
GFR calc Af Amer: 59 mL/min — ABNORMAL LOW (ref >60)
GFR calc non Af Amer: 51 mL/min — ABNORMAL LOW (ref >60)
Glucose, Bld: 118 mg/dL — ABNORMAL HIGH (ref 70–99)
Potassium: 2.9 mmol/L — ABNORMAL LOW (ref 3.5–5.1)
Sodium: 139 mmol/L (ref 135–145)

## 2018-05-16 LAB — CBC
HEMATOCRIT: 40.8 % (ref 36.0–46.0)
Hemoglobin: 12.8 g/dL (ref 12.0–15.0)
MCH: 28.4 pg (ref 26.0–34.0)
MCHC: 31.4 g/dL (ref 30.0–36.0)
MCV: 90.7 fL (ref 80.0–100.0)
Platelets: 163 10*3/uL (ref 150–400)
RBC: 4.5 MIL/uL (ref 3.87–5.11)
RDW: 11.9 % (ref 11.5–15.5)
WBC: 7.5 10*3/uL (ref 4.0–10.5)
nRBC: 0 % (ref 0.0–0.2)

## 2018-05-16 LAB — FOLATE RBC
FOLATE, RBC: 932 ng/mL (ref >498)
Folate, Hemolysate: 315.8 ng/mL
Hematocrit: 33.9 % — ABNORMAL LOW (ref 34.0–46.6)

## 2018-05-16 LAB — MAGNESIUM: Magnesium: 1.7 mg/dL (ref 1.7–2.4)

## 2018-05-16 MED ORDER — POTASSIUM CHLORIDE CRYS ER 20 MEQ PO TBCR
40.0000 meq | EXTENDED_RELEASE_TABLET | Freq: Two times a day (BID) | ORAL | Status: AC
  Administered 2018-05-16 (×2): 40 meq via ORAL
  Filled 2018-05-16 (×2): qty 2

## 2018-05-16 MED ORDER — HYDROCHLOROTHIAZIDE 12.5 MG PO CAPS
12.5000 mg | ORAL_CAPSULE | Freq: Every day | ORAL | Status: DC
  Administered 2018-05-16 – 2018-05-17 (×2): 12.5 mg via ORAL
  Filled 2018-05-16 (×2): qty 1

## 2018-05-16 MED FILL — Heparin Sod (Porcine) in NaCl IV Soln 25000 Unit/250ML-0.45%: INTRAVENOUS | Qty: 250 | Status: AC

## 2018-05-16 NOTE — Progress Notes (Addendum)
  Speech Language Pathology Treatment: Dysphagia;Cognitive-Linquistic  Patient Details Name: Stephanie Hahn MRN: [id number] DOB: Oct 13, 1945 Today's Date: 05/16/2018 Time: [id number] SLP Time Calculation (min) (ACUTE ONLY): 17 min  Assessment / Plan / Recommendation Clinical Impression  Pt's upper dentition present and cleaned prior to pt donning resulting in pt gagging and mild emesis x 2. Agreeable to subsequent sips water via straw consumed swiftly, no indications pharyngeal difficulties. She wanted to try graham cracker (sans dentures) and masticated/moistened in oral cavity effectively. Will attempt to observed with dentition donned for observation with increased amount of solids.  Pt oriented to month and reason for admission. Her processing speed improved from Fri as well as alertness. Will continue to provide cognitive stimulation to effectively navigate surroundings.   HPI HPI: Stephanie PebblesSara Roanhorse is a 72 y.o. female with medical history significant for chronic back pain, hyperthyroidism, coronary artery disease, hypertension, and depression, [facility] after she was found on the floor at home, poorly responsive, hypoxic with presumed opioid overdose. Head CT was negative for acute intracranial abnormality. CXR increased interstitial markings particularly in the apices. No focal confluent infiltrate is noted. Aspiration suspected while down.       SLP Plan  Continue with current plan of care       Recommendations  Diet recommendations: Regular;Thin liquid Liquids provided via: Cup;Straw Medication Administration: Whole meds with puree Supervision: Patient able to self feed;Intermittent supervision to cue for compensatory strategies Compensations: Slow rate;Small sips/bites Postural Changes and/or Swallow Maneuvers: Upright 30-60 min after meal;Seated upright 90 degrees                Oral Care Recommendations: Oral care BID Follow up  Recommendations: Inpatient Rehab SLP Visit Diagnosis: Dysphagia, unspecified (R13.10) Plan: Continue with current plan of care                       Royce MacadamiaLitaker, Rosbel Buckner Willis 05/16/2018, 3:26 PM   Breck CoonsLisa Willis Lonell FaceLitaker M.Ed Nurse, children'sCCC-SLP Speech-Language Pathologist Pager [phone] Office [phone]

## 2018-05-16 NOTE — Consult Note (Signed)
Physical Medicine and Rehabilitation Consult Reason for Consult: Decreased functional mobility Referring Physician: [facility]   HPI: Stephanie Hahn is a 72 y.o. female with history of chronic back pain maintained on Neurontin and Percocet, CAD, hypertension, hyperlipidemia. Per chart review and patient, patient lives alone independent prior to admission. One level home with 2 steps to entry.patient initially. Family assistance as needed. Presented to [facility] after being found down poorly responsive and hypoxic. Per EMS report saturating in the 50s with poor respiratory effort. Patient did respond to Narcan.patient noted to be in atrial fibrillation with RVR placed on heparin. Patient was transferred to [facility] for further evaluation. UDS positive for opiates and benzos. Chemistry panel creatinine 2.40 from baseline 1.0,CK 240, WBC 17,000, lactic acid reported to be normal, troponin 1.78-3.7. Cranial CT scan reviewed, unremarkable for acute intracranial process. Chest x-ray showed no focal infiltrate. Unilateral pelvic films showed a very subtle nondisplaced left femoral neck fracture could not be excluded.no evidence of dislocation. Follow-up MRI bilateral globus pallidus restricted diffusion reflecting possible acute injury from hypoxia, metabolic disturbance. Additional scattered punctate foci of restricted diffusion in both cerebral hemispheres suggesting acute embolic infarctions.Carotid Dopplers with 40-59% left ICA stenosis. EEG indicated of severe encephalopathy. Elevated troponin felt to be related to demand ischemia. Echocardiogram with ejection fraction of 65% grade 2 diastolic dysfunction. Neurology follow-up presently maintained on Eliquis for CVA prophylaxis and for noted atrial fibrillation. Tolerating a regular diet. Therapy evaluations completed with recommendations of physical medicine rehabilitation consult.   Review of Systems  Constitutional: Negative for chills  and fever.  HENT: Negative for hearing loss.   Eyes: Negative for blurred vision and double vision.  Respiratory: Negative for shortness of breath.   Gastrointestinal: Positive for constipation. Negative for nausea and vomiting.       GERD  Genitourinary: Negative for dysuria, flank pain and hematuria.  Musculoskeletal: Positive for back pain, joint pain, myalgias and neck pain.  Skin: Negative for rash.  Neurological: Positive for dizziness and headaches.  Psychiatric/Behavioral: Positive for depression.  All other systems reviewed and are negative.  History reviewed. No pertinent past medical history., unable to obtain accureately from patient. History reviewed. No pertinent surgical history., unable to obtain accureately from patient. Family History  Problem Relation Age of Onset  . Alzheimer's disease Other   . Alzheimer's disease Other    Social History:  has no tobacco, alcohol, and drug history on file., unable to obtain accureately from patient. Allergies: No Known Allergies Medications Prior to Admission  Medication Sig Dispense Refill  . atorvastatin (LIPITOR) 80 MG tablet Take 80 mg by mouth daily.  1  . buPROPion (WELLBUTRIN XL) 150 MG 24 hr tablet Take 150 mg by mouth every morning.  6  . gabapentin (NEURONTIN) 300 MG capsule Take 300 mg by mouth 2 (two) times daily.    . methimazole (TAPAZOLE) 10 MG tablet Take 10 mg by mouth daily.  0  . metoprolol succinate (TOPROL-XL) 100 MG 24 hr tablet Take 100 mg by mouth daily.  1  . nitroGLYCERIN (NITROSTAT) 0.4 MG SL tablet Place 0.4 mg under the tongue every 5 (five) minutes as needed for chest pain.  3  . omeprazole (PRILOSEC) 40 MG capsule Take 40 mg by mouth daily.  6  . oxyCODONE-acetaminophen (PERCOCET/ROXICET) 5-325 MG tablet Take 1 tablet by mouth every 6 (six) hours as needed (pain).   0    Home: Home Living Family/patient expects to be discharged to::  Private residence Living Arrangements: Other  relatives(Brother) Available Help at Discharge: Family, Available PRN/intermittently Type of Home: House Home Access: Stairs to enter Secretary/administrator of Steps: 2 Entrance Stairs-Rails: None Home Layout: One level Firefighter: Standard Home Equipment: None  Lives With: Other (Comment)(brother)  Functional History: Prior Function Level of Independence: Independent Comments: ADLs, IADLs, and driving Functional Status:  Mobility: Bed Mobility Overal bed mobility: Needs Assistance Bed Mobility: Sit to Supine, Supine to Sit Supine to sit: Min guard, HOB elevated Sit to supine: Min assist General bed mobility comments: Min A for managing BLEs. Poor attention Transfers Overall transfer level: Needs assistance Equipment used: 2 person hand held assist Transfers: Sit to/from Stand, Stand Pivot Transfers Sit to Stand: +2 physical assistance, Mod assist Stand pivot transfers: Mod assist, +2 physical assistance Squat pivot transfers: Total assist, +2 physical assistance General transfer comment: Mod A +2 for power up into standing and then Mod A +2 for pivot to [facility]. Pt with poor awareness and requring increased cues for hand placement      ADL: ADL Overall ADL's : Needs assistance/impaired Eating/Feeding: Set up, Supervision/ safety, Sitting Grooming: Set up, Supervision/safety, Bed level Upper Body Bathing: Minimal assistance, Sitting Lower Body Bathing: Moderate assistance, Sit to/from stand Upper Body Dressing : Minimal assistance, Sitting Lower Body Dressing: Moderate assistance, Sit to/from stand Lower Body Dressing Details (indicate cue type and reason): Pt able to bring left ankles to R knees for doffing sock and brings right leg to EOB. Pt requiring Mod A for sit<>stand and safety Toilet Transfer: Moderate assistance, +2 for physical assistance, Stand-pivot, BSC Toilet Transfer Details (indicate cue type and reason): Mod A+2 for sit<>Stand then pivot to  [facility] Toileting- Clothing Manipulation and Hygiene: Maximal assistance, Sit to/from stand Toileting - Clothing Manipulation Details (indicate cue type and reason): Pt with difficulty sequencing  Functional mobility during ADLs: Moderate assistance, +2 for physical assistance General ADL Comments: Pt with decreased cognition, balance, and activity tolerance  Cognition: Cognition Overall Cognitive Status: Impaired/Different from baseline Arousal/Alertness: Awake/alert Orientation Level: Oriented to person, Disoriented to place, Disoriented to time, Disoriented to situation Attention: Focused Focused Attention: Impaired Focused Attention Impairment: Verbal basic Memory: Impaired Memory Impairment: Storage deficit, Decreased recall of new information Awareness: Impaired Awareness Impairment: Intellectual impairment Problem Solving: Impaired Problem Solving Impairment: Verbal basic Behaviors: Restless Safety/Judgment: Impaired Cognition Arousal/Alertness: Awake/alert Behavior During Therapy: Flat affect, Impulsive Overall Cognitive Status: Impaired/Different from baseline Area of Impairment: Orientation, Attention, Memory, Following commands, Safety/judgement, Awareness, Problem solving Orientation Level: Disoriented to, Situation Current Attention Level: Focused Memory: Decreased short-term memory Following Commands: Follows one step commands inconsistently Safety/Judgement: Decreased awareness of deficits Awareness: Intellectual Problem Solving: Slow processing, Difficulty sequencing, Decreased initiation, Requires verbal cues General Comments: Pt able to state she was at "" but unable to state why. Also reporting her full name, DOB, and current month. Pt with poor problem solving and pcoessing requiring increased cues and time throughout session. During toielting, pt unable to problem solve how to perform toilet hygiene and required increased assistance. Pt with increased  impulsivity, requiring redirection to prevent from getting out of bed/chair every 5-10 seconds  Blood pressure (!) 155/78, pulse 81, temperature 98.7 F (37.1 C), temperature source Oral, resp. rate (!) 21, height 5\' 4"  (1.626 m), weight 93.7 kg, SpO2 95 %. Physical Exam  Vitals reviewed. Constitutional: She appears well-developed.  Obese  HENT:  Head: Normocephalic and atraumatic.  Right Ear: External ear normal.  Left Ear: External ear normal.  Eyes: EOM are normal. Right eye exhibits no discharge. Left eye exhibits no discharge.  Neck: Normal range of motion. Neck supple. No thyromegaly present.  Cardiovascular: Normal rate and regular rhythm.  Respiratory: Effort normal and breath sounds normal. No respiratory distress.  GI: Soft. Bowel sounds are normal. She exhibits no distension.  Musculoskeletal:  No edema or tenderness in extremities  Neurological: She is alert.  Speech was clear.  Provides her name and place.  Follow simple commands.  She did have some decrease in attention. Motor:Grossly 4+/5 throughout, ?Left side weaker than right  Skin: Skin is warm and dry.  Psychiatric: Her affect is blunt. Her speech is delayed. She is slowed.    Results for orders placed or performed during the hospital encounter of 05/12/18 (from the past 24 hour(s))  Glucose, capillary     Status: None   Collection Time: 05/15/18  8:28 AM  Result Value Ref Range   Glucose-Capillary 97 70 - 99 mg/dL   Comment 1 Notify RN    Comment 2 Document in Chart   Heparin level (unfractionated)     Status: Abnormal   Collection Time: 05/15/18 12:43 PM  Result Value Ref Range   Heparin Unfractionated <0.10 (L) 0.30 - 0.70 IU/mL  CBC     Status: None   Collection Time: 05/16/18  1:50 AM  Result Value Ref Range   WBC 7.5 4.0 - 10.5 K/uL   RBC 4.50 3.87 - 5.11 MIL/uL   Hemoglobin 12.8 12.0 - 15.0 g/dL   HCT 16.1 09.6 - 04.5 %   MCV 90.7 80.0 - 100.0 fL   MCH 28.4 26.0 - 34.0 pg   MCHC 31.4 30.0 -  36.0 g/dL   RDW 40.9 81.1 - 91.4 %   Platelets 163 150 - 400 K/uL   nRBC 0.0 0.0 - 0.2 %  Basic metabolic panel     Status: Abnormal   Collection Time: 05/16/18  1:50 AM  Result Value Ref Range   Sodium 139 135 - 145 mmol/L   Potassium 2.9 (L) 3.5 - 5.1 mmol/L   Chloride 99 98 - 111 mmol/L   CO2 30 22 - 32 mmol/L   Glucose, Bld 118 (H) 70 - 99 mg/dL   BUN 12 8 - 23 mg/dL   Creatinine, Ser 7.82 (H) 0.44 - 1.00 mg/dL   Calcium 8.8 (L) 8.9 - 10.3 mg/dL   GFR calc non Af Amer 51 (L) >60 mL/min   GFR calc Af Amer 59 (L) >60 mL/min   Anion gap 10 5 - 15   Vas US Carotid (at [facility])  Result Date: 05/14/2018 Carotid Arterial Duplex Study Indications: CVA. Limitations: patient movement Performing Technologist: Farrel Demark RDMS, RVT  Examination Guidelines: A complete evaluation includes B-mode imaging, spectral Doppler, color Doppler, and power Doppler as needed of all accessible portions of each vessel. Bilateral testing is considered an integral part of a complete examination. Limited examinations for reoccurring indications may be performed as noted.  Right Carotid Findings: +----------+--------+--------+--------+-------------------------+--------+           PSV cm/sEDV cm/sStenosisDescribe                 Comments +----------+--------+--------+--------+-------------------------+--------+ CCA Prox  75      14              heterogenous                      +----------+--------+--------+--------+-------------------------+--------+ CCA Distal98  19                                                +----------+--------+--------+--------+-------------------------+--------+ ICA Prox  118     32      1-39%   heterogenous and calcific         +----------+--------+--------+--------+-------------------------+--------+ ICA Distal49      15                                                +----------+--------+--------+--------+-------------------------+--------+ ECA        121     9                                                 +----------+--------+--------+--------+-------------------------+--------+ +----------+--------+-------+----------------+-------------------+           PSV cm/sEDV cmsDescribe        Arm Pressure (mmHG) +----------+--------+-------+----------------+-------------------+ [id number]            Multiphasic, WNL                    +----------+--------+-------+----------------+-------------------+ +---------+--------+--+--------+-+---------+ VertebralPSV cm/s24EDV cm/s7Antegrade +---------+--------+--+--------+-+---------+  Left Carotid Findings: +----------+--------+--------+--------+-------------------------+--------+           PSV cm/sEDV cm/sStenosisDescribe                 Comments +----------+--------+--------+--------+-------------------------+--------+ CCA Prox  93      22              heterogenous                      +----------+--------+--------+--------+-------------------------+--------+ CCA Distal82      16                                                +----------+--------+--------+--------+-------------------------+--------+ ICA Prox  187     56      40-59%  heterogenous and calcific         +----------+--------+--------+--------+-------------------------+--------+ ICA Mid   150     32                                                +----------+--------+--------+--------+-------------------------+--------+ ICA Distal63      16                                                +----------+--------+--------+--------+-------------------------+--------+ ECA       77      12                                                +----------+--------+--------+--------+-------------------------+--------+ +----------+--------+--------+----------------+-------------------+  SubclavianPSV cm/sEDV cm/sDescribe        Arm Pressure (mmHG)  +----------+--------+--------+----------------+-------------------+           141             Multiphasic, WNL                    +----------+--------+--------+----------------+-------------------+ +---------+--------+--+--------+--+---------+ VertebralPSV cm/s59EDV cm/s14Antegrade +---------+--------+--+--------+--+---------+  Summary: Right Carotid: Velocities in the right ICA are consistent with a 1-39% stenosis.                Upper end of range. Left Carotid: Velocities in the left ICA are consistent with a 40-59% stenosis.               Mid to upper end of range. Vertebrals: Bilateral vertebral arteries demonstrate antegrade flow. *See table(s) above for measurements and observations.  Electronically signed by Delia Heady MD on 05/14/2018 at 1:54:23 PM.    Final     Assessment/Plan: Diagnosis: Encephalopathy Labs and images (see above) independently reviewed.  Records reviewed and summated above.  1. Does the need for close, 24 hr/day medical supervision in concert with the patient's rehab needs make it unreasonable for this patient to be served in a less intensive setting? Yes  2. Co-Morbidities requiring supervision/potential complications: diastolic dysfunction (monitor for signs and symptoms of fluid overload), atrial fibrillation with RVR (continue meds, monitor heart rate with increased physical activity), chronic back pain (Biofeedback training with therapies to help reduce reliance on opiate pain medications, monitor pain control during therapies, and sedation at rest and titrate to maximum efficacy to ensure participation and gains in therapies), CAD, HTN with hypertensive crisis (monitor and provide prns in accordance with increased physical exertion and pain), Hyperlipidemia, hypokalemia (continue to monitor and replete as necessary), AKI (avoid nephrotoxic meds) 3. Due to safety, disease management, medication administration, pain management and patient education, does the  patient require 24 hr/day rehab nursing? Yes 4. Does the patient require coordinated care of a physician, rehab nurse, PT (1-2 hrs/day, 5 days/week), OT (1-2 hrs/day, 5 days/week) and SLP (1-2 hrs/day, 5 days/week) to address physical and functional deficits in the context of the above medical diagnosis(es)? Yes Addressing deficits in the following areas: balance, endurance, locomotion, strength, transferring, bathing, dressing, toileting, cognition and psychosocial support 5. Can the patient actively participate in an intensive therapy program of at least 3 hrs of therapy per day at least 5 days per week? Yes 6. The potential for patient to make measurable gains while on inpatient rehab is excellent 7. Anticipated functional outcomes upon discharge from inpatient rehab are supervision and min assist  with PT, supervision and min assist with OT, min assist with SLP. 8. Estimated rehab length of stay to reach the above functional goals is: 16-20 days. 9. Anticipated D/C setting: Home 10. Anticipated post D/C treatments: HH therapy and Home excercise program 11. Overall Rehab/Functional Prognosis: excellent and good  RECOMMENDATIONS: This patient's condition is appropriate for continued rehabilitative care in the following setting: CIR if caregiver support available upon discharge and patient willing when medically stable.  Patient currently states she wants to go home; encouraged discussion with family. Patient has agreed to participate in recommended program. Potentially Note that insurance prior authorization may be required for reimbursement for recommended care.  Comment: Rehab Admissions Coordinator to follow up.   I have personally performed a face to face diagnostic evaluation, including, but not limited to relevant history and physical exam findings, of this patient and developed  relevant assessment and plan.  Additionally, I have reviewed and concur with the physician assistant's  documentation above.   Maryla Morrow, MD, ABPMR Mcarthur Rossetti Angiulli, PA-C 05/16/2018

## 2018-05-16 NOTE — H&P (Signed)
Physical Medicine and Rehabilitation Admission H&P     HPI: Stephanie Hahn is a 72 year old right-handed female with history of chronic back pain maintained on Neurontin and Percocet, CAD, hypertension and hyperlipidemia. Per chart review and patient, patient lives alone independent prior to admission. One level home with 2 steps to entry. She does have family in the area that can assist. Presented to [facility] after being found down poorly responsive and hypoxic. Per EMS report saturations in the 50s with poor respiratory effort. She did respond to Narcan. Patient noted to be in atrial fibrillation with RVR placed on IV heparin. She was transferred to [facility] for further evaluation. UDS positive for opiates and benzos. Chemistry panel creatinine 2.40 from baseline 1.0, CK 240, WBC 17,000, lactic acid reported to be normal, troponin 1.78-3.7. Cranial CT scan reviewed, unremarkable for acute intracranial process. Chest x-ray showed no focal infiltrate. Unilateral pelvic films question very subtle nondisplaced left femoral neck fracture cannot be excluded. excluded no evidence of dislocation area follow-up MRI of bilateral globus pallidus restricted diffusion reflecting possible acute injury from hypoxia, metabolic disturbance. Additional scattered punctate foci of restricted diffusion in both cerebral hemispheres suggesting acute embolic infarctions. Carotid Dopplers with 40-59% left ICA stenosis. EEG indicated of severe encephalopathy. Elevated troponin felt to be related to demand ischemia. Echocardiogram with ejection fraction of 65% grade 2 diastolic dysfunction. Neurology follow-up maintained on Eliquis for both CVA prophylaxis and atrial fibrillation. Tolerating a regular diet. Therapy evaluations completed with recommendations of physical medicine rehabilitation consult. Patient was admitted for a comprehensive rehabilitation program.  Review of Systems  Constitutional: Negative  for chills and fever.  HENT: Negative for hearing loss.   Eyes: Negative for blurred vision and double vision.  Respiratory: Negative for cough and shortness of breath.   Cardiovascular: Positive for leg swelling. Negative for chest pain.  Gastrointestinal: Positive for constipation. Negative for nausea and vomiting.       GERD  Genitourinary: Negative for dysuria and hematuria.  Musculoskeletal: Positive for back pain, joint pain and myalgias.  Skin: Negative for rash.  Neurological: Positive for dizziness and headaches.  Psychiatric/Behavioral: Positive for depression.  All other systems reviewed and are negative.  History reviewed. No pertinent past medical history. History reviewed. No pertinent surgical history. Family History  Problem Relation Age of Onset  . Alzheimer's disease Other   . Alzheimer's disease Other    Social History:  has no tobacco, alcohol, and drug history on file. Allergies: No Known Allergies Medications Prior to Admission  Medication Sig Dispense Refill  . atorvastatin (LIPITOR) 80 MG tablet Take 80 mg by mouth daily.  1  . buPROPion (WELLBUTRIN XL) 150 MG 24 hr tablet Take 150 mg by mouth every morning.  6  . gabapentin (NEURONTIN) 300 MG capsule Take 300 mg by mouth 2 (two) times daily.    . methimazole (TAPAZOLE) 10 MG tablet Take 10 mg by mouth daily.  0  . metoprolol succinate (TOPROL-XL) 100 MG 24 hr tablet Take 100 mg by mouth daily.  1  . nitroGLYCERIN (NITROSTAT) 0.4 MG SL tablet Place 0.4 mg under the tongue every 5 (five) minutes as needed for chest pain.  3  . omeprazole (PRILOSEC) 40 MG capsule Take 40 mg by mouth daily.  6  . oxyCODONE-acetaminophen (PERCOCET/ROXICET) 5-325 MG tablet Take 1 tablet by mouth every 6 (six) hours as needed (pain).   0    Drug Regimen Review Drug regimen was reviewed and remains appropriate  with no significant issues identified  Home: Home Living Family/patient expects to be discharged to:: Private  residence Living Arrangements: Other relatives(Brother) Available Help at Discharge: Family, Available PRN/intermittently Type of Home: House Home Access: Stairs to enter Secretary/administrator of Steps: 2 Entrance Stairs-Rails: None Home Layout: One level Firefighter: Standard Home Equipment: None  Lives With: Other (Comment)(brother)   Functional History: Prior Function Level of Independence: Independent Comments: ADLs, IADLs, and driving  Functional Status:  Mobility: Bed Mobility Overal bed mobility: Needs Assistance Bed Mobility: Sit to Supine, Supine to Sit Supine to sit: Min guard, HOB elevated Sit to supine: Min assist General bed mobility comments: Min A for managing BLEs. Poor attention Transfers Overall transfer level: Needs assistance Equipment used: 2 person hand held assist Transfers: Sit to/from Stand, Stand Pivot Transfers Sit to Stand: +2 physical assistance, Mod assist Stand pivot transfers: Mod assist, +2 physical assistance Squat pivot transfers: Total assist, +2 physical assistance General transfer comment: Mod A +2 for power up into standing and then Mod A +2 for pivot to [facility]. Pt with poor awareness and requring increased cues for hand placement      ADL: ADL Overall ADL's : Needs assistance/impaired Eating/Feeding: Set up, Supervision/ safety, Sitting Grooming: Set up, Supervision/safety, Bed level Upper Body Bathing: Minimal assistance, Sitting Lower Body Bathing: Moderate assistance, Sit to/from stand Upper Body Dressing : Minimal assistance, Sitting Lower Body Dressing: Moderate assistance, Sit to/from stand Lower Body Dressing Details (indicate cue type and reason): Pt able to bring left ankles to R knees for doffing sock and brings right leg to EOB. Pt requiring Mod A for sit<>stand and safety Toilet Transfer: Moderate assistance, +2 for physical assistance, Stand-pivot, BSC Toilet Transfer Details (indicate cue type and reason): Mod A+2  for sit<>Stand then pivot to [facility] Toileting- Clothing Manipulation and Hygiene: Maximal assistance, Sit to/from stand Toileting - Clothing Manipulation Details (indicate cue type and reason): Pt with difficulty sequencing  Functional mobility during ADLs: Moderate assistance, +2 for physical assistance General ADL Comments: Pt with decreased cognition, balance, and activity tolerance  Cognition: Cognition Overall Cognitive Status: Impaired/Different from baseline Arousal/Alertness: Awake/alert Orientation Level: Oriented to person, Oriented to place, Disoriented to time, Disoriented to situation Attention: Focused Focused Attention: Impaired Focused Attention Impairment: Verbal basic Memory: Impaired Memory Impairment: Storage deficit, Decreased recall of new information Awareness: Impaired Awareness Impairment: Intellectual impairment Problem Solving: Impaired Problem Solving Impairment: Verbal basic Behaviors: Restless Safety/Judgment: Impaired Cognition Arousal/Alertness: Awake/alert Behavior During Therapy: Flat affect, Impulsive Overall Cognitive Status: Impaired/Different from baseline Area of Impairment: Orientation, Attention, Memory, Following commands, Safety/judgement, Awareness, Problem solving Orientation Level: Disoriented to, Situation Current Attention Level: Focused Memory: Decreased short-term memory Following Commands: Follows one step commands inconsistently Safety/Judgement: Decreased awareness of deficits Awareness: Intellectual Problem Solving: Slow processing, Difficulty sequencing, Decreased initiation, Requires verbal cues General Comments: Pt able to state she was at "[employer]" but unable to state why. Also reporting her full name, DOB, and current month. Pt with poor problem solving and pcoessing requiring increased cues and time throughout session. During toielting, pt unable to problem solve how to perform toilet hygiene and required increased  assistance. Pt with increased impulsivity, requiring redirection to prevent from getting out of bed/chair every 5-10 seconds  Physical Exam: Blood pressure (!) 160/99, pulse 99, temperature 99.1 F (37.3 C), temperature source Oral, resp. rate 15, height 5\' 4"  (1.626 m), weight 93.7 kg, SpO2 97 %. Physical Exam  Constitutional: No distress.  obese  HENT:  Head: Normocephalic  and atraumatic.  Eyes: Pupils are equal, round, and reactive to light. EOM are normal.  Neck: Normal range of motion. No tracheal deviation present. No thyromegaly present.  Cardiovascular: Normal rate and regular rhythm. Exam reveals no friction rub.  No murmur heard. Respiratory: Effort normal. No respiratory distress. She has no wheezes. She has no rales.  GI: Soft. She exhibits no distension. There is no tenderness.  Musculoskeletal: She exhibits no edema or deformity.  Neurological: She is alert.  Patient is alert. Oriented to person, place. Limited insight and awareness. Follows basic commands. UE motor 4/5. LE: 3/5 HF, KE and 4/5 ADF/PF. Senses pain and light touch in all 4's.   Skin: Skin is warm and dry. She is not diaphoretic.  Psychiatric:  Flat, cooperative    Results for orders placed or performed during the hospital encounter of 05/12/18 (from the past 48 hour(s))  Glucose, capillary     Status: None   Collection Time: 05/15/18  8:28 AM  Result Value Ref Range   Glucose-Capillary 97 70 - 99 mg/dL   Comment 1 Notify RN    Comment 2 Document in Chart   Heparin level (unfractionated)     Status: Abnormal   Collection Time: 05/15/18 12:43 PM  Result Value Ref Range   Heparin Unfractionated <0.10 (L) 0.30 - 0.70 IU/mL    Comment: (NOTE) If heparin results are below expected values, and patient dosage has  been confirmed, suggest follow up testing of antithrombin III levels. Performed at [facility], [address] [place], [place] [address]   CBC     Status: None   Collection Time:  05/16/18  1:50 AM  Result Value Ref Range   WBC 7.5 4.0 - 10.5 K/uL   RBC 4.50 3.87 - 5.11 MIL/uL   Hemoglobin 12.8 12.0 - 15.0 g/dL   HCT 69.640.8 29.536.0 - 28.446.0 %   MCV 90.7 80.0 - 100.0 fL   MCH 28.4 26.0 - 34.0 pg   MCHC 31.4 30.0 - 36.0 g/dL   RDW 13.211.9 44.011.5 - 10.215.5 %   Platelets 163 150 - 400 K/uL   nRBC 0.0 0.0 - 0.2 %    Comment: Performed at [facility], [address] [place], [place] [address]  Basic metabolic panel     Status: Abnormal   Collection Time: 05/16/18  1:50 AM  Result Value Ref Range   Sodium 139 135 - 145 mmol/L   Potassium 2.9 (L) 3.5 - 5.1 mmol/L   Chloride 99 98 - 111 mmol/L   CO2 30 22 - 32 mmol/L   Glucose, Bld 118 (H) 70 - 99 mg/dL   BUN 12 8 - 23 mg/dL   Creatinine, Ser 6.441.09 (H) 0.44 - 1.00 mg/dL   Calcium 8.8 (L) 8.9 - 10.3 mg/dL   GFR calc non Af Amer 51 (L) >60 mL/min   GFR calc Af Amer 59 (L) >60 mL/min   Anion gap 10 5 - 15    Comment: Performed at [facility], [address] [place], [place] [address]  Magnesium     Status: None   Collection Time: 05/16/18  1:50 AM  Result Value Ref Range   Magnesium 1.7 1.7 - 2.4 mg/dL    Comment: Performed at [facility], [address] [place], [place] [address]  Basic metabolic panel     Status: Abnormal   Collection Time: 05/17/18  2:14 AM  Result Value Ref Range   Sodium  140 135 - 145 mmol/L   Potassium 3.1 (L) 3.5 - 5.1 mmol/L   Chloride 98 98 - 111 mmol/L   CO2 27 22 - 32 mmol/L   Glucose, Bld 116 (H) 70 - 99 mg/dL   BUN 10 8 - 23 mg/dL   Creatinine, Ser 1.61 (H) 0.44 - 1.00 mg/dL   Calcium 8.8 (L) 8.9 - 10.3 mg/dL   GFR calc non Af Amer 49 (L) >60 mL/min   GFR calc Af Amer 56 (L) >60 mL/min   Anion gap 15 5 - 15    Comment: Performed at [facility], [address] [place], [place] [address]   No results found.     Medical Problem List and Plan: 1.  Decreased functional mobility secondary to  Encephalopathy secondary to hypoxic event due to potential  unintentional drug overdose 2.  DVT Prophylaxis/Anticoagulation:  Eliquis 3. Pain Management/chronic back pain:  Tylenol as needed. Patient had been maintained on Neurontin and Percocet prior to admission for chronic back pain discontinued due to encephalopathy. Patient did receive Narcan. 4. Mood:  Provide emotional support as needed 5. Neuropsych: This patient is not capable of making decisions on her own behalf. 6. Skin/Wound Care:  Routine skin checks 7. Fluids/Electrolytes/Nutrition:  Routine ins and outs with follow-up chemistries. Encourage po 8. A. Fib with RVR. Cardiac rate controlled. Elevated troponin felt to be related to demand ischemia. Continue Eliquis 9. Hypertension. HCTZ 12.5 mg daily, Toprol 100 mg daily. Monitor with increased mobility 10. Hyperthyroidism. Continue Tapazole 11. Acute kidney injury. Follow-up chemistries. Baseline creatinine 1.0 12. Acute left leg pain. X-rays and imaging revealed possible subtle nondisplaced left femoral neck fracture cannot be excluded. May need to pursue CT scan if any further complaints 13. Hyperlipidemia. Lipitor/Zetia   Post Admission Physician Evaluation: 1. 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 2. Patient is admitted to receive collaborative, interdisciplinary care between the physiatrist, rehab nursing staff, and therapy team. 3. Patient's level of medical complexity and substantial therapy needs in context of that medical necessity cannot be provided at a lesser intensity of care such as a SNF. 4. Patient has experienced substantial functional loss from his/her baseline which was documented above under the "Functional History" and "Functional Status" headings.  Judging by the patient's diagnosis, physical exam, and functional history, the patient has potential for functional progress which will result in measurable gains while on inpatient rehab.  These gains will be of substantial and practical use upon discharge   in facilitating mobility and self-care at the household level. 5. Physiatrist will provide 24 hour management of medical needs as well as oversight of the therapy plan/treatment and provide guidance as appropriate regarding the interaction of the two. 6. The Preadmission Screening has been reviewed and patient status is unchanged unless otherwise stated above. 7. 24 hour rehab nursing will assist with bladder management, bowel management, safety, skin/wound care, disease management, medication administration, pain management and patient education  and help integrate therapy concepts, techniques,education, etc. 8. PT will assess and treat for/with: Lower extremity strength, range of motion, stamina, balance, functional mobility, safety, adaptive techniques and equipment, NMR, family education.   Goals are: supervision to min assist. 9. OT will assess and treat for/with: ADL's, functional mobility, safety, upper extremity strength, adaptive techniques and equipment, NMR, family ed.   Goals are: supervision to min assist. Therapy may proceed with showering this patient. 10. SLP will assess and treat for/with: cognition, communication, family ed.  Goals are: min assist.  11. Case Management and Social Worker will assess and treat for psychological issues and discharge planning. 12. Team conference will be held weekly to assess progress toward goals and to determine barriers to discharge. 13. Patient will receive at least 3 hours of therapy per day at least 5 days per week. 14. ELOS: 16-20 days       15. Prognosis:  excellent   I have personally performed a face to face diagnostic evaluation of this patient and formulated the key components of the plan.  Additionally, I have personally reviewed laboratory data, imaging studies, as well as relevant notes and concur with the physician assistant's documentation above.  Ranelle Oyster, MD, FAAPMR    Mcarthur Rossetti Angiulli, PA-C 05/17/2018

## 2018-05-16 NOTE — Progress Notes (Signed)
Inpatient Rehabilitation-Admissions Coordinator    Met with patient and her son at the bedside to discuss team's recommendation for inpatient rehabilitation. Shared booklets, expectations while in CIR, expected length of stay, and anticipated functional level at DC. Pt is reluctant to choose CIR as she wants to return home. AC encouraged pt to discuss this further with her family. Son plans to follow up with [facility] later today with a decision. Per son, they would have 24/7 A in place for her at DC.   AC to follow up for final decision.   Jhonnie Garner, OTR/L  Rehab Admissions Coordinator  [phone] 05/16/2018 1:17 PM

## 2018-05-16 NOTE — Progress Notes (Signed)
Magnesium level-1.7 MD made aware through text page, no order given.

## 2018-05-16 NOTE — Progress Notes (Addendum)
PROGRESS NOTE    Stephanie Hahn  EAV:[id number] DOB: [birth year] DOA: 05/12/2018 PCP: Barron Alvine, MD     Brief Narrative:  Stephanie Hahn is a 72 y.o. female with medical history significant for chronic back pain, hyperthyroidism, coronary artery disease, hypertension, and depression, who presented to the [facility] emergency department with EMS after she was found on the floor at home, poorly responsive and hypoxic.  Family is at the bedside and assist with the history.  It had been approximately 24 hours since the family member had spoken with her on the phone, she did not return calls on the day of presentation, and when family went to check on her, she was found on the floor poorly responsive.  She was saturating in the 50s on EMS arrival with poor respiratory effort and poor responsiveness.  She responded to Narcan, had seizure-like activity that resolved with Versed, and was brought into the ED. Upon arrival to the ED, patient is found to be obtunded with poor respiratory effort, again responding well to Narcan, afebrile, tachycardic, with stable blood pressure, and saturating adequately on room air after Narcan administration.  EKG features sinus tachycardia with rate 113 and PVCs.  Noncontrast head CT was negative for acute intracranial abnormality.  Chest x-ray is notable for cardiomegaly, peribronchial thickening, and interstitial prominence, possibly suggestive of early interstitial edema.  Chemistry panel is notable for a creatinine of 2.40, up from a baseline of 1.0.  Ethanol level is undetectable and TSH was normal.  CBC features a leukocytosis to 17,100 with normal H&H and normal platelets.  Lactic acid was reassuringly normal, INR normal and urinalysis unremarkable.  Troponin was elevated at 1.78, increased further to 3.0, and then 3.7 while in the ED.  Patient received Narcan multiple times, Versed, 1 L of normal saline, duo nebs, cefepime, vancomycin, and Flagyl in the ED.  She was also given  125 mg of IV Solu-Medrol and started on a heparin infusion.  She has been transferred to stepdown unit at [facility] for further evaluation and management. Work up revealed new A Fib RVR, embolic stroke. She was evaluated by neurology and worked up for stroke. She was started on Eliquis.   New events last 24 hours / Subjective: Confusion has improved, although still requires reminders regarding plan of care. She is not able to recall what happened prior to hospitalization. She is wondering when she can go home. Denies any chest pain, SOB, N/V/abdominal pain today.   Assessment & Plan:   Principal Problem:   Overdose opiate, accidental or unintentional, subsequent encounter Active Problems:   NSTEMI (non-ST elevated myocardial infarction) (HCC)   Aspiration pneumonia (HCC)   Chronic pain   Acute encephalopathy   Seizure-like activity (HCC)   Opiate overdose (HCC)   Left leg pain   Essential hypertension   CAD (coronary artery disease)   Cerebral embolism with cerebral infarction   Acute metabolic encephalopathy Differential diagnosis with opioid overdose, as her encephalopathy improved with Narcan administration. UDS positive for opiates and benzo  CT head negative  MRI brain: Bilateral globus pallidus restricted diffusion which may reflect acute injury from hypoxia, toxic exposure, or metabolic disturbance EEG: The patient is comatose and not responding, throughout the recording bilateral sharp waves were noted that built up at times and became rhythmic but no ictal phase was noticed in the recording. Diffuse triphasic waves were also noted that might represent severe encephalopathy. Confusion improving, although still requiring frequent reminders about plan of care   Embolic CVA  Neurology following Echo - EF 65%, grade 2 dd, no patent foramen ovale  Carotid US - unremarkable  PT OT SLP - rec CIR, consult placed   A Fib RVR Now normal sinus rhythm Continue toprol  Eliquis    Acute  hypoxic respiratory failure Improved with Narcan Chest x-ray reviewed independently, no focal consolidation noted.  Stop Unasyn Now on room air  NSTEMI type II Likely demand ischemia in setting of hypoxic event Troponin peaked at 3.7 and trended downward to 1.56  EKG reviewed independently which revealed normal sinus rhythm without ST change  Patient denies any chest pain Echocardiogram without wall motion abnormalities   Seizure-like activity Episode of seizure-like activity in the emergency department that resolved with Versed Continue seizure precaution EEG: The patient is comatose and not responding, throughout the recording bilateral sharp waves were noted that built up at times and became rhythmic but no ictal phase was noticed in the recording. Diffuse triphasic waves were also noted that might represent severe encephalopathy.  Acute kidney injury Baseline creatinine 1 Stable back to baseline   Hyperthyroidism Continue Tapazole, metoprolol  HTN Continue metoprolol, start HCTZ for better BP control   HLD Continue lipitor   Chronic pain Patient has chronic back pain managed at home with Percocet No pain currently  Acute left leg pain Imaging of the left lower extremity without acute fracture, although hip x-ray reveals very subtle nondisplaced left femoral neck fracture cannot be excluded. May pursue CT scan if patient has any complaints of leg pain  Hypokalemia Replace, trend    DVT prophylaxis: Eliquis  Code Status: Full  Family Communication: Daughter at bedside  Disposition Plan: CIR eval pending    Consultants:   Neurology  Procedures:   None   Antimicrobials:  Anti-infectives (From admission, onward)   Start     Dose/Rate Route Frequency Ordered Stop   05/13/18 0600  Ampicillin-Sulbactam (UNASYN) 3 g in sodium chloride 0.9 % 100 mL IVPB  Status:  Discontinued     3 g 200 mL/hr over 30 Minutes Intravenous Every 8 hours 05/13/18 0146 05/13/18 1240         Objective: Vitals:   05/15/18 2000 05/15/18 2006 05/16/18 0400 05/16/18 0752  BP: (!) 165/78 (!) 155/78  (!) 184/95  Pulse:  81    Resp: 15 18 (!) 21 17  Temp: 99.1 F (37.3 C) 98.7 F (37.1 C)  98.6 F (37 C)  TempSrc: Oral Oral  Oral  SpO2:  95%    Weight:      Height:        Intake/Output Summary (Last 24 hours) at 05/16/2018 0911 Last data filed at 05/15/2018 2003 Gross per 24 hour  Intake 440 ml  Output 250 ml  Net 190 ml   Filed Weights   05/12/18 2308 05/13/18 0534 05/14/18 0500  Weight: 95.4 kg 96.5 kg 93.7 kg     Examination: General exam: Appears comfortable, anxious  Respiratory system: Clear to auscultation. Respiratory effort normal. Cardiovascular system: S1 & S2 heard, RRR. No JVD, murmurs, rubs, gallops or clicks. No pedal edema. Gastrointestinal system: Abdomen is nondistended, soft and nontender. No organomegaly or masses felt. Normal bowel sounds heard. Central nervous system: Alert and oriented. No focal neurological deficits. Extremities: Symmetric 5 x 5 power. Skin: No rashes, lesions or ulcers Psychiatry: Judgement and insight appear poor      Data Reviewed: I have personally reviewed following labs and imaging studies  CBC: Recent Labs  Lab 05/13/18 0018 05/14/18  0158 05/15/18 0236 05/16/18 0150  WBC 13.4* 9.0 10.6* 7.5  NEUTROABS 10.4*  --   --   --   HGB 11.4* 11.2* 13.3 12.8  HCT 38.4 37.6 43.8 40.8  MCV 96.5 95.9 91.4 90.7  PLT 147* 159 161 163   Basic Metabolic Panel: Recent Labs  Lab 05/13/18 0018 05/14/18 0158 05/15/18 0236 05/16/18 0150  NA 141 144 139 139  K 4.2 3.9 3.6 2.9*  CL 104 106 99 99  CO2 26 29 24 30   GLUCOSE 110* 118* 115* 118*  BUN 28* 16 11 12   CREATININE 1.76* 1.10* 1.05* 1.09*  CALCIUM 8.6* 8.9 9.4 8.8*  MG  --   --   --  1.7   GFR: Estimated Creatinine Clearance: 51.8 mL/min (A) (by C-G formula based on SCr of 1.09 mg/dL (H)). Liver Function Tests: Recent Labs  Lab 05/13/18 0018   AST 32  ALT 20  ALKPHOS 76  BILITOT 0.7  PROT 6.5  ALBUMIN 3.3*   No results for input(s): LIPASE, AMYLASE in the last 168 hours. Recent Labs  Lab 05/13/18 0018  AMMONIA 40*   Coagulation Profile: No results for input(s): INR, PROTIME in the last 168 hours. Cardiac Enzymes: Recent Labs  Lab 05/13/18 0018 05/13/18 0559 05/13/18 1142 05/14/18 0158  CKTOTAL 240*  --   --   --   TROPONINI 3.10* 2.46* 1.58* 1.56*   BNP (last 3 results) No results for input(s): PROBNP in the last 8760 hours. HbA1C: Recent Labs    05/14/18 0158  HGBA1C 4.4*   CBG: Recent Labs  Lab 05/13/18 0931 05/14/18 0803 05/15/18 0828  GLUCAP 105* 111* 97   Lipid Profile: Recent Labs    05/14/18 0158  CHOL 153  HDL 43  LDLCALC 85  TRIG 124  CHOLHDL 3.6   Thyroid Function Tests: No results for input(s): TSH, T4TOTAL, FREET4, T3FREE, THYROIDAB in the last 72 hours. Anemia Panel: No results for input(s): VITAMINB12, FOLATE, FERRITIN, TIBC, IRON, RETICCTPCT in the last 72 hours. Sepsis Labs: No results for input(s): PROCALCITON, LATICACIDVEN in the last 168 hours.  Recent Results (from the past 240 hour(s))  MRSA PCR Screening     Status: None   Collection Time: 05/12/18  9:54 PM  Result Value Ref Range Status   MRSA by PCR NEGATIVE NEGATIVE Final    Comment:        The GeneXpert MRSA Assay (FDA approved for NASAL specimens only), is one component of a comprehensive MRSA colonization surveillance program. It is not intended to diagnose MRSA infection nor to guide or monitor treatment for MRSA infections. Performed at [facility], [address] [place], [place] [address]        Radiology Studies: Vas Koreas Carotid (at [facility])  Result Date: 05/14/2018 Carotid Arterial Duplex Study Indications: CVA. Limitations: patient movement Performing Technologist: Farrel DemarkJill Eunice RDMS, RVT  Examination Guidelines: A complete evaluation includes B-mode imaging, spectral Doppler,  color Doppler, and power Doppler as needed of all accessible portions of each vessel. Bilateral testing is considered an integral part of a complete examination. Limited examinations for reoccurring indications may be performed as noted.  Right Carotid Findings: +----------+--------+--------+--------+-------------------------+--------+           PSV cm/sEDV cm/sStenosisDescribe                 Comments +----------+--------+--------+--------+-------------------------+--------+ CCA Prox  75      14  heterogenous                      +----------+--------+--------+--------+-------------------------+--------+ CCA Distal98      19                                                +----------+--------+--------+--------+-------------------------+--------+ ICA Prox  118     32      1-39%   heterogenous and calcific         +----------+--------+--------+--------+-------------------------+--------+ ICA Distal49      15                                                +----------+--------+--------+--------+-------------------------+--------+ ECA       121     9                                                 +----------+--------+--------+--------+-------------------------+--------+ +----------+--------+-------+----------------+-------------------+           PSV cm/sEDV cmsDescribe        Arm Pressure (mmHG) +----------+--------+-------+----------------+-------------------+ [id number]            Multiphasic, WNL                    +----------+--------+-------+----------------+-------------------+ +---------+--------+--+--------+-+---------+ VertebralPSV cm/s24EDV cm/s7Antegrade +---------+--------+--+--------+-+---------+  Left Carotid Findings: +----------+--------+--------+--------+-------------------------+--------+           PSV cm/sEDV cm/sStenosisDescribe                 Comments  +----------+--------+--------+--------+-------------------------+--------+ CCA Prox  93      22              heterogenous                      +----------+--------+--------+--------+-------------------------+--------+ CCA Distal82      16                                                +----------+--------+--------+--------+-------------------------+--------+ ICA Prox  187     56      40-59%  heterogenous and calcific         +----------+--------+--------+--------+-------------------------+--------+ ICA Mid   150     32                                                +----------+--------+--------+--------+-------------------------+--------+ ICA Distal63      16                                                +----------+--------+--------+--------+-------------------------+--------+ ECA       77      12                                                +----------+--------+--------+--------+-------------------------+--------+ +----------+--------+--------+----------------+-------------------+  SubclavianPSV cm/sEDV cm/sDescribe        Arm Pressure (mmHG) +----------+--------+--------+----------------+-------------------+           141             Multiphasic, WNL                    +----------+--------+--------+----------------+-------------------+ +---------+--------+--+--------+--+---------+ VertebralPSV cm/s59EDV cm/s14Antegrade +---------+--------+--+--------+--+---------+  Summary: Right Carotid: Velocities in the right ICA are consistent with a 1-39% stenosis.                Upper end of range. Left Carotid: Velocities in the left ICA are consistent with a 40-59% stenosis.               Mid to upper end of range. Vertebrals: Bilateral vertebral arteries demonstrate antegrade flow. *See table(s) above for measurements and observations.  Electronically signed by Delia Heady MD on 05/14/2018 at 1:54:23 PM.    Final       Scheduled Meds: .  stroke: mapping  our early stages of recovery book   Does not apply Once  . apixaban  5 mg Oral BID  . atorvastatin  80 mg Oral Daily  . ezetimibe  10 mg Oral Daily  . mouth rinse  15 mL Mouth Rinse BID  . methimazole  10 mg Oral Daily  . metoprolol succinate  100 mg Oral Daily  . pantoprazole  80 mg Oral Daily  . potassium chloride  40 mEq Oral BID  . sodium chloride flush  3 mL Intravenous Q12H  . sodium chloride flush  3 mL Intravenous Q12H   Continuous Infusions: . sodium chloride       LOS: 4 days    Time spent: 25 minutes   Noralee Stain, DO [facility] [email or web address] Password [email or web address]  05/16/2018, 9:11 AM

## 2018-05-17 ENCOUNTER — Encounter (HOSPITAL_COMMUNITY): Payer: Self-pay | Admitting: Emergency Medicine

## 2018-05-17 ENCOUNTER — Other Ambulatory Visit: Payer: Self-pay

## 2018-05-17 ENCOUNTER — Inpatient Hospital Stay (HOSPITAL_COMMUNITY)
Admission: RE | Admit: 2018-05-17 | Discharge: 2018-05-24 | DRG: 092 | Disposition: A | Discharge status: Home Health | Payer: Medicare Other | Source: Intra-hospital | Attending: Physical Medicine & Rehabilitation | Admitting: Physical Medicine & Rehabilitation

## 2018-05-17 DIAGNOSIS — I4891 Unspecified atrial fibrillation: Secondary | ICD-10-CM | POA: Diagnosis present

## 2018-05-17 DIAGNOSIS — Z7901 Long term (current) use of anticoagulants: Secondary | ICD-10-CM

## 2018-05-17 DIAGNOSIS — Z820 Family history of epilepsy and other diseases of the nervous system: Secondary | ICD-10-CM

## 2018-05-17 DIAGNOSIS — I10 Essential (primary) hypertension: Secondary | ICD-10-CM

## 2018-05-17 DIAGNOSIS — G8929 Other chronic pain: Secondary | ICD-10-CM | POA: Diagnosis present

## 2018-05-17 DIAGNOSIS — I129 Hypertensive chronic kidney disease with stage 1 through stage 4 chronic kidney disease, or unspecified chronic kidney disease: Secondary | ICD-10-CM | POA: Diagnosis present

## 2018-05-17 DIAGNOSIS — N183 Chronic kidney disease, stage 3 unspecified: Secondary | ICD-10-CM

## 2018-05-17 DIAGNOSIS — E785 Hyperlipidemia, unspecified: Secondary | ICD-10-CM | POA: Diagnosis present

## 2018-05-17 DIAGNOSIS — I248 Other forms of acute ischemic heart disease: Secondary | ICD-10-CM | POA: Diagnosis present

## 2018-05-17 DIAGNOSIS — M545 Low back pain, unspecified: Secondary | ICD-10-CM

## 2018-05-17 DIAGNOSIS — R2689 Other abnormalities of gait and mobility: Secondary | ICD-10-CM | POA: Diagnosis present

## 2018-05-17 DIAGNOSIS — E0590 Thyrotoxicosis, unspecified without thyrotoxic crisis or storm: Secondary | ICD-10-CM | POA: Diagnosis present

## 2018-05-17 DIAGNOSIS — G9340 Encephalopathy, unspecified: Secondary | ICD-10-CM | POA: Diagnosis not present

## 2018-05-17 DIAGNOSIS — Z8673 Personal history of transient ischemic attack (TIA), and cerebral infarction without residual deficits: Secondary | ICD-10-CM | POA: Diagnosis not present

## 2018-05-17 DIAGNOSIS — S72002D Fracture of unspecified part of neck of left femur, subsequent encounter for closed fracture with routine healing: Secondary | ICD-10-CM | POA: Diagnosis not present

## 2018-05-17 DIAGNOSIS — G931 Anoxic brain damage, not elsewhere classified: Secondary | ICD-10-CM

## 2018-05-17 DIAGNOSIS — I6930 Unspecified sequelae of cerebral infarction: Secondary | ICD-10-CM | POA: Diagnosis not present

## 2018-05-17 DIAGNOSIS — M5136 Other intervertebral disc degeneration, lumbar region: Secondary | ICD-10-CM | POA: Diagnosis present

## 2018-05-17 DIAGNOSIS — Z79899 Other long term (current) drug therapy: Secondary | ICD-10-CM | POA: Diagnosis not present

## 2018-05-17 DIAGNOSIS — N179 Acute kidney failure, unspecified: Secondary | ICD-10-CM | POA: Diagnosis present

## 2018-05-17 DIAGNOSIS — I2510 Atherosclerotic heart disease of native coronary artery without angina pectoris: Secondary | ICD-10-CM | POA: Diagnosis present

## 2018-05-17 DIAGNOSIS — I480 Paroxysmal atrial fibrillation: Secondary | ICD-10-CM | POA: Diagnosis not present

## 2018-05-17 LAB — BASIC METABOLIC PANEL
ANION GAP: 15 (ref 5–15)
BUN: 10 mg/dL (ref 8–23)
CO2: 27 mmol/L (ref 22–32)
Calcium: 8.8 mg/dL — ABNORMAL LOW (ref 8.9–10.3)
Chloride: 98 mmol/L (ref 98–111)
Creatinine, Ser: 1.13 mg/dL — ABNORMAL HIGH (ref 0.44–1.00)
GFR calc Af Amer: 56 mL/min — ABNORMAL LOW (ref >60)
GFR calc non Af Amer: 49 mL/min — ABNORMAL LOW (ref >60)
Glucose, Bld: 116 mg/dL — ABNORMAL HIGH (ref 70–99)
POTASSIUM: 3.1 mmol/L — AB (ref 3.5–5.1)
Sodium: 140 mmol/L (ref 135–145)

## 2018-05-17 LAB — GLUCOSE, CAPILLARY: Glucose-Capillary: 103 mg/dL — ABNORMAL HIGH (ref 70–99)

## 2018-05-17 MED ORDER — APIXABAN 5 MG PO TABS
5.0000 mg | ORAL_TABLET | Freq: Two times a day (BID) | ORAL | Status: DC
  Administered 2018-05-17 – 2018-05-24 (×14): 5 mg via ORAL
  Filled 2018-05-17 (×14): qty 1

## 2018-05-17 MED ORDER — EZETIMIBE 10 MG PO TABS: 10.0000 mg | ORAL_TABLET | Freq: Every day | ORAL | Status: DC

## 2018-05-17 MED ORDER — ONDANSETRON HCL 4 MG/2ML IJ SOLN: 4.0000 mg | Freq: Four times a day (QID) | INTRAMUSCULAR | Status: DC | PRN

## 2018-05-17 MED ORDER — HYDROCHLOROTHIAZIDE 12.5 MG PO CAPS: 12.5000 mg | ORAL_CAPSULE | Freq: Every day | ORAL | Status: DC

## 2018-05-17 MED ORDER — PANTOPRAZOLE SODIUM 40 MG PO TBEC
80.0000 mg | DELAYED_RELEASE_TABLET | Freq: Every day | ORAL | Status: DC
  Administered 2018-05-18 – 2018-05-24 (×7): 80 mg via ORAL
  Filled 2018-05-17 (×8): qty 2

## 2018-05-17 MED ORDER — HYDROCHLOROTHIAZIDE 12.5 MG PO CAPS
12.5000 mg | ORAL_CAPSULE | Freq: Every day | ORAL | Status: DC
  Administered 2018-05-18 – 2018-05-24 (×7): 12.5 mg via ORAL
  Filled 2018-05-17 (×7): qty 1

## 2018-05-17 MED ORDER — ACETAMINOPHEN 325 MG PO TABS
650.0000 mg | ORAL_TABLET | Freq: Four times a day (QID) | ORAL | Status: DC | PRN
  Administered 2018-05-17 – 2018-05-20 (×8): 650 mg via ORAL
  Filled 2018-05-17 (×8): qty 2

## 2018-05-17 MED ORDER — SORBITOL 70 % SOLN: 30.0000 mL | Freq: Every day | Status: DC | PRN

## 2018-05-17 MED ORDER — ACETAMINOPHEN 650 MG RE SUPP: 650.0000 mg | Freq: Four times a day (QID) | RECTAL | Status: DC | PRN

## 2018-05-17 MED ORDER — ATORVASTATIN CALCIUM 80 MG PO TABS
80.0000 mg | ORAL_TABLET | Freq: Every day | ORAL | Status: DC
  Administered 2018-05-18 – 2018-05-24 (×7): 80 mg via ORAL
  Filled 2018-05-17 (×7): qty 1

## 2018-05-17 MED ORDER — METHIMAZOLE 10 MG PO TABS
10.0000 mg | ORAL_TABLET | Freq: Every day | ORAL | Status: DC
  Administered 2018-05-18 – 2018-05-24 (×7): 10 mg via ORAL
  Filled 2018-05-17 (×7): qty 1

## 2018-05-17 MED ORDER — APIXABAN 5 MG PO TABS: 5.0000 mg | ORAL_TABLET | Freq: Two times a day (BID) | ORAL | Status: DC

## 2018-05-17 MED ORDER — ONDANSETRON HCL 4 MG PO TABS: 4.0000 mg | ORAL_TABLET | Freq: Four times a day (QID) | ORAL | Status: DC | PRN

## 2018-05-17 MED ORDER — EZETIMIBE 10 MG PO TABS
10.0000 mg | ORAL_TABLET | Freq: Every day | ORAL | Status: DC
  Administered 2018-05-18 – 2018-05-24 (×7): 10 mg via ORAL
  Filled 2018-05-17 (×7): qty 1

## 2018-05-17 MED ORDER — SENNOSIDES-DOCUSATE SODIUM 8.6-50 MG PO TABS: 1.0000 | ORAL_TABLET | Freq: Every evening | ORAL | Status: DC | PRN

## 2018-05-17 MED ORDER — BISACODYL 10 MG RE SUPP: 10.0000 mg | Freq: Every day | RECTAL | Status: DC | PRN

## 2018-05-17 MED ORDER — POTASSIUM CHLORIDE CRYS ER 20 MEQ PO TBCR
40.0000 meq | EXTENDED_RELEASE_TABLET | ORAL | Status: AC
  Administered 2018-05-17 (×2): 40 meq via ORAL
  Filled 2018-05-17 (×2): qty 2

## 2018-05-17 MED ORDER — METOPROLOL SUCCINATE ER 50 MG PO TB24
100.0000 mg | ORAL_TABLET | Freq: Every day | ORAL | Status: DC
  Administered 2018-05-18 – 2018-05-24 (×7): 100 mg via ORAL
  Filled 2018-05-17 (×7): qty 2

## 2018-05-17 NOTE — Progress Notes (Signed)
Physical Therapy Treatment Patient Details Name: Stephanie Hahn MRN: [id number] DOB: 08/07/1945 Today's Date: 05/17/2018    History of Present Illness Pt is a 72 y.o. F with significant PMH of chronic back pain, CAD, hypertension, depression who presents after unresponsive, hypoxic event. MRI brain showing bilateral globus pallidus restricted diffusion which may reflect acute injury from hypoxia, toxic exposure or metabolic disturbance. Of note, imaging of left lower extremity without acute fracture but hip x-ray reveals very subtle nondisplaced left femoral neck fracture.     PT Comments    Pt making good progress. Continue to recommend CIR for physical and cognitive deficits.    Follow Up Recommendations  CIR     Equipment Recommendations  Other (comment)(TBA)    Recommendations for Other Services Rehab consult     Precautions / Restrictions Precautions Precautions: Fall Restrictions Weight Bearing Restrictions: No    Mobility  Bed Mobility Overal bed mobility: Needs Assistance Bed Mobility: Supine to Sit     Supine to sit: Min assist     General bed mobility comments: Assist to elevate trunk into sitting  Transfers Overall transfer level: Needs assistance Equipment used: 2 person hand held assist;Rolling walker (2 wheeled) Transfers: Sit to/from Stand Sit to Stand: Mod assist;Min assist         General transfer comment: Assist to bring hips up and for balance. Mod assist on initial stand. Min assist on subsequent stands. Verbal cues for hand placment  Ambulation/Gait Ambulation/Gait assistance: Mod assist;Min assist Gait Distance (Feet): 50 Feet(x 2) Assistive device: Rolling walker (2 wheeled) Gait Pattern/deviations: Step-through pattern;Decreased stride length;Drifts right/left Gait velocity: decr Gait velocity interpretation: <1.31 ft/sec, indicative of household ambulator General Gait Details: Assist for balance and support. Initially mod assist for  balance progressing to min assist as distance increased. Verbal cues to stay closer to walker   Stairs             Wheelchair Mobility    Modified Rankin (Stroke Patients Only) Modified Rankin (Stroke Patients Only) Pre-Morbid Rankin Score: No symptoms Modified Rankin: Moderately severe disability     Balance Overall balance assessment: Needs assistance Sitting-balance support: Feet supported;No upper extremity supported Sitting balance-Leahy Scale: Fair     Standing balance support: Bilateral upper extremity supported Standing balance-Leahy Scale: Poor Standing balance comment: walker and min guard for static standing                            Cognition Arousal/Alertness: Awake/alert Behavior During Therapy: Flat affect Overall Cognitive Status: Impaired/Different from baseline Area of Impairment: Attention;Memory;Following commands;Safety/judgement;Awareness                   Current Attention Level: Sustained Memory: Decreased short-term memory Following Commands: Follows one step commands consistently Safety/Judgement: Decreased awareness of deficits;Decreased awareness of safety Awareness: Intellectual          Exercises      General Comments        Pertinent Vitals/Pain Pain Assessment: No/denies pain    Home Living                      Prior Function            PT Goals (current goals can now be found in the care plan section) Progress towards PT goals: Progressing toward goals;Goals met and updated - see care plan    Frequency    Min 4X/week        PT Plan Current plan remains appropriate    Co-evaluation              AM-PAC PT "6 Clicks" Mobility   Outcome Measure  Help needed turning from your back to your side while in a flat bed without using bedrails?: A Little Help needed moving from lying on your back to sitting on the side of a flat bed without using bedrails?: A Little Help needed  moving to and from a bed to a chair (including a wheelchair)?: A Lot Help needed standing up from a chair using your arms (e.g., wheelchair or bedside chair)?: A Lot Help needed to walk in hospital room?: A Lot Help needed climbing 3-5 steps with a railing? : Total 6 Click Score: 13    End of Session Equipment Utilized During Treatment: Gait belt Activity Tolerance: Patient tolerated treatment well Patient left: in chair;with call bell/phone within reach;with chair alarm set;Other (comment)(video sitter) Nurse Communication: Mobility status PT Visit Diagnosis: Other abnormalities of gait and mobility (R26.89);Other symptoms and signs involving the nervous system (R29.898)     Time: 1047-1105 PT Time Calculation (min) (ACUTE ONLY): 18 min  Charges:  $Gait Training: 8-22 mins                     Cary Maycock PT Acute Rehabilitation Services Pager [phone] Office [phone]    Cary W Maycok 05/17/2018, 2:48 PM   

## 2018-05-17 NOTE — Progress Notes (Signed)
PROGRESS NOTE    Stephanie Hahn  RUE:[id number] DOB: 07/24/1945 DOA: 05/12/2018 PCP: Barron Alvine, MD     Brief Narrative:  Stephanie Hahn is a 72 y.o. female with medical history significant for chronic back pain, hyperthyroidism, coronary artery disease, hypertension, and depression, who presented to the [facility] emergency department with EMS after she was found on the floor at home, poorly responsive and hypoxic.  Family is at the bedside and assist with the history.  It had been approximately 24 hours since the family member had spoken with her on the phone, she did not return calls on the day of presentation, and when family went to check on her, she was found on the floor poorly responsive.  She was saturating in the 50s on EMS arrival with poor respiratory effort and poor responsiveness.  She responded to Narcan, had seizure-like activity that resolved with Versed, and was brought into the ED. Upon arrival to the ED, patient is found to be obtunded with poor respiratory effort, again responding well to Narcan, afebrile, tachycardic, with stable blood pressure, and saturating adequately on room air after Narcan administration.  EKG features sinus tachycardia with rate 113 and PVCs.  Noncontrast head CT was negative for acute intracranial abnormality.  Chest x-ray is notable for cardiomegaly, peribronchial thickening, and interstitial prominence, possibly suggestive of early interstitial edema.  Chemistry panel is notable for a creatinine of 2.40, up from a baseline of 1.0.  Ethanol level is undetectable and TSH was normal.  CBC features a leukocytosis to 17,100 with normal H&H and normal platelets.  Lactic acid was reassuringly normal, INR normal and urinalysis unremarkable.  Troponin was elevated at 1.78, increased further to 3.0, and then 3.7 while in the ED.  Patient received Narcan multiple times, Versed, 1 L of normal saline, duo nebs, cefepime, vancomycin, and Flagyl in the ED.  She was also given  125 mg of IV Solu-Medrol and started on a heparin infusion.  She has been transferred to stepdown unit at [facility] for further evaluation and management. Work up revealed new A Fib RVR, embolic stroke. She was evaluated by neurology and worked up for stroke. She was started on Eliquis.   New events last 24 hours / Subjective: No new issues, note vomiting episode this morning.  Feeling well otherwise.  Assessment & Plan:   Principal Problem:   Overdose opiate, accidental or unintentional, subsequent encounter Active Problems:   NSTEMI (non-ST elevated myocardial infarction) (HCC)   Aspiration pneumonia (HCC)   Chronic pain   Acute encephalopathy   Seizure-like activity (HCC)   Opiate overdose (HCC)   Left leg pain   Essential hypertension   CAD (coronary artery disease)   Cerebral embolism with cerebral infarction   Atrial fibrillation with rapid ventricular response (HCC)   Hypertensive crisis   AKI (acute kidney injury) (HCC)   Hypokalemia   Acute metabolic encephalopathy -Differential diagnosis with opioid overdose, as her encephalopathy improved with Narcan administration. UDS positive for opiates and benzo  -CT head negative  -MRI brain: Bilateral globus pallidus restricted diffusion which may reflect acute injury from hypoxia, toxic exposure, or metabolic disturbance -EEG: The patient is comatose and not responding, throughout the recording bilateral sharp waves were noted that built up at times and became rhythmic but no ictal phase was noticed in the recording. Diffuse triphasic waves were also noted that might represent severe encephalopathy. -Slowly improving  Embolic CVA -Neurology consulted  -Echo - EF 65%, grade 2 dd, no patent foramen ovale  -  Carotid US - unremarkable  -PT OT SLP - rec CIR, consult placed   A Fib RVR -Now normal sinus rhythm -Continue toprol  -Eliquis    Acute hypoxic respiratory failure -Improved with Narcan -Chest x-ray reviewed independently,  no focal consolidation noted.  Stop Unasyn -Now on room air  NSTEMI type II -Likely demand ischemia in setting of hypoxic event -Troponin peaked at 3.7 and trended downward to 1.56  -EKG reviewed independently which revealed normal sinus rhythm without ST change  -Patient denies any chest pain -Echocardiogram without wall motion abnormalities   Seizure-like activity -Episode of seizure-like activity in the emergency department that resolved with Versed -Continue seizure precaution -EEG: The patient is comatose and not responding, throughout the recording bilateral sharp waves were noted that built up at times and became rhythmic but no ictal phase was noticed in the recording. Diffuse triphasic waves were also noted that might represent severe encephalopathy.  Acute kidney injury -Baseline creatinine 1 -Stable back to baseline   Hyperthyroidism -Continue Tapazole, metoprolol  HTN -Continue metoprolol, start HCTZ for better BP control   HLD -Continue lipitor   Chronic pain -Patient has chronic back pain managed at home with Percocet -No pain currently  Acute left leg pain -Imaging of the left lower extremity without acute fracture, although hip x-ray reveals very subtle nondisplaced left femoral neck fracture cannot be excluded. May pursue CT scan if patient has any complaints of leg pain  Hypokalemia -Replace, trend    DVT prophylaxis: Eliquis  Code Status: Full  Family Communication: No family at bedside  Disposition Plan: CIR eval pending    Consultants:   Neurology  Procedures:   None   Antimicrobials:  Anti-infectives (From admission, onward)   Start     Dose/Rate Route Frequency Ordered Stop   05/13/18 0600  Ampicillin-Sulbactam (UNASYN) 3 g in sodium chloride 0.9 % 100 mL IVPB  Status:  Discontinued     3 g 200 mL/hr over 30 Minutes Intravenous Every 8 hours 05/13/18 0146 05/13/18 1240       Objective: Vitals:   05/16/18 2322 05/17/18 0534  05/17/18 0746 05/17/18 1132  BP: (!) 181/97 (!) 160/99 (!) 170/108 (!) 158/85  Pulse: 99   (!) 107  Resp: 20 15 20 20   Temp: 98.2 F (36.8 C) 99.1 F (37.3 C) 98.3 F (36.8 C) 98.1 F (36.7 C)  TempSrc: Oral Oral Oral Oral  SpO2: 97% 97% 99% 99%  Weight:      Height:        Intake/Output Summary (Last 24 hours) at 05/17/2018 1326 Last data filed at 05/17/2018 1227 Gross per 24 hour  Intake 800 ml  Output -  Net 800 ml   Filed Weights   05/12/18 2308 05/13/18 0534 05/14/18 0500  Weight: 95.4 kg 96.5 kg 93.7 kg    Examination: General exam: Appears calm and comfortable  Respiratory system: Clear to auscultation. Respiratory effort normal. Cardiovascular system: S1 & S2 heard, RRR. No JVD, murmurs, rubs, gallops or clicks. No pedal edema. Gastrointestinal system: Abdomen is nondistended, soft and nontender. No organomegaly or masses felt. Normal bowel sounds heard. Central nervous system: Alert and oriented. No focal neurological deficits. Extremities: Symmetric 5 x 5 power. Skin: No rashes, lesions or ulcers Psychiatry: Judgement and insight appear stable   Data Reviewed: I have personally reviewed following labs and imaging studies  CBC: Recent Labs  Lab 05/13/18 0018 05/14/18 0158 05/15/18 0236 05/16/18 0150  WBC 13.4* 9.0 10.6* 7.5  NEUTROABS 10.4*  --   --   --  HGB 11.4* 11.2* 13.3 12.8  HCT 38.4  33.9* 37.6 43.8 40.8  MCV 96.5 95.9 91.4 90.7  PLT 147* 159 161 163   Basic Metabolic Panel: Recent Labs  Lab 05/13/18 0018 05/14/18 0158 05/15/18 0236 05/16/18 0150 05/17/18 0214  NA 141 144 139 139 140  K 4.2 3.9 3.6 2.9* 3.1*  CL 104 106 99 99 98  CO2 26 29 24 30 27   GLUCOSE 110* 118* 115* 118* 116*  BUN 28* 16 11 12 10   CREATININE 1.76* 1.10* 1.05* 1.09* 1.13*  CALCIUM 8.6* 8.9 9.4 8.8* 8.8*  MG  --   --   --  1.7  --    GFR: Estimated Creatinine Clearance: 49.9 mL/min (A) (by C-G formula based on SCr of 1.13 mg/dL (H)). Liver Function  Tests: Recent Labs  Lab 05/13/18 0018  AST 32  ALT 20  ALKPHOS 76  BILITOT 0.7  PROT 6.5  ALBUMIN 3.3*   No results for input(s): LIPASE, AMYLASE in the last 168 hours. Recent Labs  Lab 05/13/18 0018  AMMONIA 40*   Coagulation Profile: No results for input(s): INR, PROTIME in the last 168 hours. Cardiac Enzymes: Recent Labs  Lab 05/13/18 0018 05/13/18 0559 05/13/18 1142 05/14/18 0158  CKTOTAL 240*  --   --   --   TROPONINI 3.10* 2.46* 1.58* 1.56*   BNP (last 3 results) No results for input(s): PROBNP in the last 8760 hours. HbA1C: No results for input(s): HGBA1C in the last 72 hours. CBG: Recent Labs  Lab 05/13/18 0931 05/14/18 0803 05/15/18 0828 05/17/18 1131  GLUCAP 105* 111* 97 103*   Lipid Profile: No results for input(s): CHOL, HDL, LDLCALC, TRIG, CHOLHDL, LDLDIRECT in the last 72 hours. Thyroid Function Tests: No results for input(s): TSH, T4TOTAL, FREET4, T3FREE, THYROIDAB in the last 72 hours. Anemia Panel: No results for input(s): VITAMINB12, FOLATE, FERRITIN, TIBC, IRON, RETICCTPCT in the last 72 hours. Sepsis Labs: No results for input(s): PROCALCITON, LATICACIDVEN in the last 168 hours.  Recent Results (from the past 240 hour(s))  MRSA PCR Screening     Status: None   Collection Time: 05/12/18  9:54 PM  Result Value Ref Range Status   MRSA by PCR NEGATIVE NEGATIVE Final    Comment:        The GeneXpert MRSA Assay (FDA approved for NASAL specimens only), is one component of a comprehensive MRSA colonization surveillance program. It is not intended to diagnose MRSA infection nor to guide or monitor treatment for MRSA infections. Performed at [facility], [address] [place], [place] [address]        Radiology Studies: No results found.    Scheduled Meds: .  stroke: mapping our early stages of recovery book   Does not apply Once  . apixaban  5 mg Oral BID  . atorvastatin  80 mg Oral Daily  . ezetimibe  10 mg Oral  Daily  . hydrochlorothiazide  12.5 mg Oral Daily  . mouth rinse  15 mL Mouth Rinse BID  . methimazole  10 mg Oral Daily  . metoprolol succinate  100 mg Oral Daily  . pantoprazole  80 mg Oral Daily  . sodium chloride flush  3 mL Intravenous Q12H  . sodium chloride flush  3 mL Intravenous Q12H   Continuous Infusions: . sodium chloride       LOS: 5 days    Time spent: 25 minutes   Noralee Stain, DO [facility] [email or web address] Password [facility]  05/17/2018,  1:26 PM

## 2018-05-17 NOTE — Progress Notes (Signed)
Vomited to undigested food in small amount. Mouth care done, relieved after few min.

## 2018-05-17 NOTE — Plan of Care (Signed)
  Problem: Consults Goal: RH GENERAL PATIENT EDUCATION Description See Patient Education module for education specifics. Outcome: Not Progressing Goal: Skin Care Protocol Initiated - if Braden Score 18 or less Description If consults are not indicated, leave blank or document N/A Outcome: Not Progressing   Problem: RH BOWEL ELIMINATION Goal: RH STG MANAGE BOWEL WITH ASSISTANCE Description STG Manage Bowel with min Assistance.  Outcome: Not Progressing Goal: RH STG MANAGE BOWEL W/MEDICATION W/ASSISTANCE Description STG Manage Bowel with Medication with min Assistance.  Outcome: Not Progressing   Problem: RH BLADDER ELIMINATION Goal: RH STG MANAGE BLADDER WITH ASSISTANCE Description STG Manage Bladder With min Assistance  Outcome: Not Progressing   Problem: RH SKIN INTEGRITY Goal: RH STG SKIN FREE OF INFECTION/BREAKDOWN Outcome: Not Progressing Goal: RH STG MAINTAIN SKIN INTEGRITY WITH ASSISTANCE Description STG Maintain Skin Integrity With min Assistance.  Outcome: Not Progressing   Problem: RH SAFETY Goal: RH STG ADHERE TO SAFETY PRECAUTIONS W/ASSISTANCE/DEVICE Description STG Adhere to Safety Precautions With min Assistance/Device.  Outcome: Not Progressing Goal: RH STG DECREASED RISK OF FALL WITH ASSISTANCE Description STG Decreased Risk of Fall With min Assistance.  Outcome: Not Progressing   Problem: RH PAIN MANAGEMENT Goal: RH STG PAIN MANAGED AT OR BELOW PT'S PAIN GOAL Outcome: Not Progressing   Problem: RH KNOWLEDGE DEFICIT GENERAL Goal: RH STG INCREASE KNOWLEDGE OF SELF CARE AFTER HOSPITALIZATION Outcome: Not Progressing  New admit, no progression .

## 2018-05-17 NOTE — PMR Pre-admission (Signed)
PMR Admission Coordinator Pre-Admission Assessment  Patient: Stephanie Hahn is an 72 y.o., female MRN: [id number] DOB: 08/14/1945 Height: _0  (162.6 cm) Weight: 93.7 kg              Insurance Information HMO:     PPO:      PCP:      IPA:      80/20:      OTHER:  PRIMARY: Medicare part A only      Policy#: [id number]      Subscriber: Patient CM Name:       Phone#:      Fax#:  Pre-Cert#:       Employer:  Benefits:  Phone #: NA     Name: Verified eligibility online via [employer] on 05/17/18 Eff. Date: Part A effective: 11/07/10     Deduct: $1,364      Out of Pocket Max: NA      Life Max: NA CIR: Covered per Medicare Guidelines once yearly deductible is met.   SNF: Covered per Medicare guidelines; days 1-20, 100%; days 21-100, 80% Outpatient: no benefit with Part A    Co-Pay:  Home Health: 100%      Co-Pay:  DME: no benefit with Part A     Co-Pay:  Providers: SECONDARY: BCBS      Policy#: [id number]      Subscriber: Patient CM Name:       Phone#:      Fax#:  Pre-Cert#:       Employer:  Benefits:  Phone #: [phone]     Name:  Eff. Date:      Deduct:       Out of Pocket Max:       Life Max:  CIR:       SNF:  Outpatient:      Co-Pay:  Home Health:       Co-Pay:  DME:      Co-Pay:   Medicaid Application Date:       Case Manager:  Disability Application Date:      Case Worker:   Emergency Contact Information Contact Information    Name Relation Home Work Mobile   [place] Daughter [phone]     [place]x,Vanessa Sister [phone]       Current Medical History  Patient Admitting Diagnosis: Encephalopathy  History of Present Illness: Arvetta Araque is a 72 year old right-handed female with history of chronic back pain maintained on Neurontin and Percocet, CAD, hypertension and hyperlipidemia. Per chart review and patient, patient lives alone independent prior to admission. One level home with 2 steps to entry. She does have family in the area that can assist. Presented to  [facility] after being found down poorly responsive and hypoxic. Per EMS report saturations in the 50s with poor respiratory effort. She did respond to Narcan. Patient noted to be in atrial fibrillation with RVR placed on IV heparin. She was transferred to [facility] for further evaluation. UDS positive for opiates and benzos. Chemistry panel creatinine 2.40 from baseline 1.0, CK 240, WBC 17,000, lactic acid reported to be normal, troponin 1.78-3.7. Cranial CT scan reviewed, unremarkable for acute intracranial process. Chest x-ray showed no focal infiltrate. Unilateral pelvic films question very subtle nondisplaced left femoral neck fracture cannot be excluded. excluded no evidence of dislocation area follow-up MRI of bilateral globus pallidus restricted diffusion reflecting possible acute injury from hypoxia, metabolic disturbance. Additional scattered punctate foci of restricted diffusion in both cerebral hemispheres suggesting acute embolic infarctions. Carotid Dopplers  with 40-59% left ICA stenosis. EEG indicated of severe encephalopathy. Elevated troponin felt to be related to demand ischemia. Echocardiogram with ejection fraction of 78% grade 2 diastolic dysfunction. Neurology follow-up maintained on Eliquis for both CVA prophylaxis and atrial fibrillation. Tolerating a regular diet. Therapy evaluations completed with recommendations of physical medicine rehabilitation consult. Patient is to be admitted for a comprehensive rehabilitation program on 05/17/18.      Past Medical History  History reviewed. No pertinent past medical history.  Family History  family history includes Alzheimer's disease in her other and other.  Prior Rehab/Hospitalizations:  Has the patient had major surgery during 100 days prior to admission? No  Current Medications   Current Facility-Administered Medications:  .   stroke: mapping our early stages of recovery book, , Does not apply, Once, Amie Portland, MD .  0.9 %  sodium chloride infusion, 250 mL, Intravenous, PRN, Opyd, Ilene Qua, MD .  acetaminophen (TYLENOL) tablet 650 mg, 650 mg, Oral, Q6H PRN, 650 mg at 05/14/18 1319 **OR** acetaminophen (TYLENOL) suppository 650 mg, 650 mg, Rectal, Q6H PRN, Opyd, Ilene Qua, MD .  apixaban (ELIQUIS) tablet 5 mg, 5 mg, Oral, BID, Alvira Philips, [place], 5 mg at 05/16/18 2157 .  atorvastatin (LIPITOR) tablet 80 mg, 80 mg, Oral, Daily, Dessa Phi, DO, 80 mg at 05/16/18 0940 .  bisacodyl (DULCOLAX) suppository 10 mg, 10 mg, Rectal, Daily PRN, Opyd, Ilene Qua, MD .  ezetimibe (ZETIA) tablet 10 mg, 10 mg, Oral, Daily, Dessa Phi, DO, 10 mg at 05/16/18 0940 .  hydrALAZINE (APRESOLINE) injection 10 mg, 10 mg, Intravenous, Q4H PRN, Dessa Phi, DO, 10 mg at 05/16/18 2354 .  hydrochlorothiazide (MICROZIDE) capsule 12.5 mg, 12.5 mg, Oral, Daily, Dessa Phi, DO, 12.5 mg at 05/16/18 1215 .  MEDLINE mouth rinse, 15 mL, Mouth Rinse, BID, Opyd, Ilene Qua, MD, 15 mL at 05/16/18 2157 .  methimazole (TAPAZOLE) tablet 10 mg, 10 mg, Oral, Daily, Dessa Phi, DO, 10 mg at 05/16/18 0940 .  metoprolol succinate (TOPROL-XL) 24 hr tablet 100 mg, 100 mg, Oral, Daily, Dessa Phi, DO, 100 mg at 05/16/18 0940 .  naloxone (NARCAN) injection 0.4 mg, 0.4 mg, Intravenous, PRN, Opyd, Ilene Qua, MD .  ondansetron (ZOFRAN) tablet 4 mg, 4 mg, Oral, Q6H PRN **OR** ondansetron (ZOFRAN) injection 4 mg, 4 mg, Intravenous, Q6H PRN, Opyd, Timothy S, MD .  pantoprazole (PROTONIX) EC tablet 80 mg, 80 mg, Oral, Daily, Dessa Phi, DO, 80 mg at 05/16/18 0940 .  potassium chloride SA (K-DUR,KLOR-CON) CR tablet 40 mEq, 40 mEq, Oral, Q4H, Choi, Jennifer, DO .  senna-docusate (Senokot-S) tablet 1 tablet, 1 tablet, Oral, QHS PRN, Opyd, Timothy S, MD .  sodium chloride flush (NS) 0.9 % injection 3 mL, 3 mL, Intravenous, Q12H, Opyd, Ilene Qua, MD, 3 mL at 05/16/18 2157 .  sodium chloride flush (NS) 0.9 % injection 3 mL, 3 mL,  Intravenous, Q12H, Opyd, Ilene Qua, MD, 3 mL at 05/15/18 1202 .  sodium chloride flush (NS) 0.9 % injection 3 mL, 3 mL, Intravenous, PRN, Opyd, Ilene Qua, MD  Patients Current Diet:  Diet Order            Diet regular Room service appropriate? Yes; Fluid consistency: Thin  Diet effective now              Precautions / Restrictions Precautions Precautions: Fall Precaution Comments: watch BP Restrictions Weight Bearing Restrictions: No   Has the patient had 2 or more falls or a fall with  injury in the past year?No  Prior Activity Level Limited Community (1-2x/wk): retired; did drive PTA: didnt get out of the house much; Independent PTA  Home Assistive Devices / Equipment Home Equipment: None  Prior Device Use: Indicate devices/aids used by the patient prior to current illness, exacerbation or injury? None of the above  Prior Functional Level Prior Function Level of Independence: Independent Comments: ADLs, IADLs, and driving  Self Care: Did the patient need help bathing, dressing, using the toilet or eating?  Independent  Indoor Mobility: Did the patient need assistance with walking from room to room (with or without device)? Independent  Stairs: Did the patient need assistance with internal or external stairs (with or without device)? Unknown, avoided  Functional Cognition: Did the patient need help planning regular tasks such as shopping or remembering to take medications? Independent  Current Functional Level Cognition  Arousal/Alertness: Awake/alert Overall Cognitive Status: Impaired/Different from baseline Current Attention Level: Focused Orientation Level: Oriented to person, Oriented to place, Disoriented to time, Disoriented to situation Following Commands: Follows one step commands inconsistently Safety/Judgement: Decreased awareness of deficits General Comments: Pt able to state she was at "[place]" but unable to state why. Also reporting her full name,  DOB, and current month. Pt with poor problem solving and pcoessing requiring increased cues and time throughout session. During toielting, pt unable to problem solve how to perform toilet hygiene and required increased assistance. Pt with increased impulsivity, requiring redirection to prevent from getting out of bed/chair every 5-10 seconds Attention: Focused Focused Attention: Impaired Focused Attention Impairment: Verbal basic Memory: Impaired Memory Impairment: Storage deficit, Decreased recall of new information Awareness: Impaired Awareness Impairment: Intellectual impairment Problem Solving: Impaired Problem Solving Impairment: Verbal basic Behaviors: Restless Safety/Judgment: Impaired    Extremity Assessment (includes Sensation/Coordination)  Upper Extremity Assessment: Generalized weakness, RUE deficits/detail RUE Deficits / Details: Noting slight decreased strength at right UE   Lower Extremity Assessment: Defer to PT evaluation LLE Deficits / Details: 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 Pt requiring Mod A for sit<>stand and safety Toilet Transfer: Moderate assistance, +2 for physical assistance, Stand-pivot, BSC Toilet Transfer Details (indicate cue type and reason): Mod A+2 for sit<>Stand then pivot to [facility] Toileting- Clothing Manipulation and Hygiene: Maximal assistance, Sit to/from stand Toileting - Clothing Manipulation Details  (indicate cue type and reason): Pt with difficulty sequencing  Functional mobility during ADLs: Moderate assistance, +2 for physical assistance General ADL Comments: Pt with decreased cognition, balance, and activity tolerance    Mobility  Overal bed mobility: Needs Assistance Bed Mobility: Sit to Supine, Supine to Sit Supine to sit: Min guard, HOB elevated Sit to supine: Min assist General bed mobility comments: Min A for managing BLEs. Poor attention    Transfers  Overall transfer level: Needs assistance Equipment used: 2 person hand held assist Transfers: Sit to/from Stand, Stand Pivot Transfers Sit to Stand: +2 physical assistance, Mod assist Stand pivot transfers: Mod assist, +2 physical assistance Squat pivot transfers: Total assist, +2 physical assistance General  transfer comment: Mod A +2 for power up into standing and then Mod A +2 for pivot to [facility]. Pt with poor awareness and requring increased cues for hand placement    Ambulation / Gait / Stairs / Wheelchair Mobility       Posture / Balance Balance Overall balance assessment: Needs assistance Sitting-balance support: Bilateral upper extremity supported, Feet supported Sitting balance-Leahy Scale: Fair Standing balance support: No upper extremity supported, During functional activity Standing balance-Leahy Scale: Poor Standing balance comment: Requiring physical A    Special needs/care consideration BiPAP/CPAP: no CPM: no Continuous Drip IV: 0.9% sodium chloride infusion Dialysis: no        Days: NA Life Vest: no Oxygen: no Special Bed: no Trach Size: no Wound Vac (area): no      Location: NA Skin: no areas of concern                      Location: NA Bowel mgmt:continent, last BM 05/16/18. Bladder mgmt:continent Diabetic mgmt: no     Previous Home Environment Living Arrangements: Other relatives(Brother)  Lives With: Other (Comment)(brother) Available Help at Discharge: Family, Available  PRN/intermittently Type of Home: House Home Layout: One level Home Access: Stairs to enter Entrance Stairs-Rails: None Entrance Stairs-Number of Steps: 2 Bathroom Toilet: [place]: No  Discharge Living Setting Plans for Discharge Living Setting: Patient's home(brother stays with her most days) Type of Home at Discharge: House Discharge Home Layout: One level Discharge Home Access: Stairs to enter Entrance Stairs-Rails: None Entrance Stairs-Number of Steps: 1 Discharge Bathroom Shower/Tub: Tub/shower unit Discharge Bathroom Toilet: Standard Discharge Bathroom Accessibility: Yes How Accessible: Accessible via walker Does the patient have any problems obtaining your medications?: No  Social/Family/Support Systems 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, retired; ) Sport and exercise psychologist Information: son Cherre Huger): [phone]; Elwin Sleight (daughter): [phone];  Anticipated Caregiver: 3 children; Nicole Kindred, Tamika, Crystal); all three plan to split shifts Anticipated Caregiver's Contact Information: see above Ability/Limitations of Caregiver: Her grown children can provide Min/Mod A; brother can do supervision Caregiver Availability: 24/7 Discharge Plan Discussed with Primary Caregiver: Yes(with tony and patient) Is Caregiver In Agreement with Plan?: Yes Does Caregiver/Family have Issues with Lodging/Transportation while Pt is in Rehab?: No   Goals/Additional Needs Patient/Family Goal for Rehab: PT/OT: supervision/Min A; SLP: min A Expected length of stay: 16-20 days Cultural Considerations: NA Dietary Needs: regular diet, thin liquids Equipment Needs: TBD Pt/Family Agrees to Admission and willing to participate: Yes Program Orientation Provided & Reviewed with Pt/Caregiver Including Roles  & Responsibilities: Yes(with pt and her son, Nicole Kindred)  Barriers to Discharge: Home environment access/layout  Barriers to Discharge Comments: step to enter; small bathroom space   Decrease  burden of Care through IP rehab admission: NA   Possible need for SNF placement upon discharge: Not anticipated; pt has strong social support at DC through her 3 children and has good prognosis for further progress with CIR.    Patient Condition: This patient's medical and functional status has changed since the consult dated 05/16/18 in which the Rehabilitation Physician determined and documented that the patient was potentially appropriate for intensive rehabilitative care in an inpatient rehabilitation facility. Issues have been addressed and update has been discussed with Dr. Naaman Plummer and patient now appropriate for inpatient rehabilitation. Pt has 24/7 caregiver assist in place at DC between the pt's three children and brother and pt is now agreeable to participating in [facility] program. Will admit to inpatient rehab today.   Preadmission Screen Completed By:  Jhonnie Garner, 05/17/2018 8:20 AM ______________________________________________________________________   Discussed status with Dr. Naaman Plummer on 05/17/18 at 4:28PM and received telephone approval for admission today.  Admission Coordinator:  Jhonnie Garner, time 4:28PM Sudie Grumbling 05/17/18.

## 2018-05-17 NOTE — H&P (Signed)
Physical Medicine and Rehabilitation Admission H&P       HPI: Stephanie Hahn is a 72 year old right-handed female with history of chronic back pain maintained on Neurontin and Percocet, CAD, hypertension and hyperlipidemia. Per chart review and patient, patient lives alone independent prior to admission. One level home with 2 steps to entry. She does have family in the area that can assist. Presented to [facility] after being found down poorly responsive and hypoxic. Per EMS report saturations in the 50s with poor respiratory effort. She did respond to Narcan. Patient noted to be in atrial fibrillation with RVR placed on IV heparin. She was transferred to [facility] for further evaluation. UDS positive for opiates and benzos. Chemistry panel creatinine 2.40 from baseline 1.0, CK 240, WBC 17,000, lactic acid reported to be normal, troponin 1.78-3.7. Cranial CT scan reviewed, unremarkable for acute intracranial process. Chest x-ray showed no focal infiltrate. Unilateral pelvic films question very subtle nondisplaced left femoral neck fracture cannot be excluded. excluded no evidence of dislocation area follow-up MRI of bilateral globus pallidus restricted diffusion reflecting possible acute injury from hypoxia, metabolic disturbance. Additional scattered punctate foci of restricted diffusion in both cerebral hemispheres suggesting acute embolic infarctions. Carotid Dopplers with 40-59% left ICA stenosis. EEG indicated of severe encephalopathy. Elevated troponin felt to be related to demand ischemia. Echocardiogram with ejection fraction of 65% grade 2 diastolic dysfunction. Neurology follow-up maintained on Eliquis for both CVA prophylaxis and atrial fibrillation. Tolerating a regular diet. Therapy evaluations completed with recommendations of physical medicine rehabilitation consult. Patient was admitted for a comprehensive rehabilitation program.   Review of Systems  Constitutional:  Negative for chills and fever.  HENT: Negative for hearing loss.   Eyes: Negative for blurred vision and double vision.  Respiratory: Negative for cough and shortness of breath.   Cardiovascular: Positive for leg swelling. Negative for chest pain.  Gastrointestinal: Positive for constipation. Negative for nausea and vomiting.       GERD  Genitourinary: Negative for dysuria and hematuria.  Musculoskeletal: Positive for back pain, joint pain and myalgias.  Skin: Negative for rash.  Neurological: Positive for dizziness and headaches.  Psychiatric/Behavioral: Positive for depression.  All other systems reviewed and are negative.   History reviewed. No pertinent past medical history. History reviewed. No pertinent surgical history.      Family History  Problem Relation Age of Onset  . Alzheimer's disease Other    . Alzheimer's disease Other      Social History:  has no tobacco, alcohol, and drug history on file. Allergies: No Known Allergies       Medications Prior to Admission  Medication Sig Dispense Refill  . atorvastatin (LIPITOR) 80 MG tablet Take 80 mg by mouth daily.   1  . buPROPion (WELLBUTRIN XL) 150 MG 24 hr tablet Take 150 mg by mouth every morning.   6  . gabapentin (NEURONTIN) 300 MG capsule Take 300 mg by mouth 2 (two) times daily.      . methimazole (TAPAZOLE) 10 MG tablet Take 10 mg by mouth daily.   0  . metoprolol succinate (TOPROL-XL) 100 MG 24 hr tablet Take 100 mg by mouth daily.   1  . nitroGLYCERIN (NITROSTAT) 0.4 MG SL tablet Place 0.4 mg under the tongue every 5 (five) minutes as needed for chest pain.   3  . omeprazole (PRILOSEC) 40 MG capsule Take 40 mg by mouth daily.   6  . oxyCODONE-acetaminophen (PERCOCET/ROXICET) 5-325 MG tablet  Take 1 tablet by mouth every 6 (six) hours as needed (pain).    0      Drug Regimen Review Drug regimen was reviewed and remains appropriate with no significant issues identified   Home: Home Living Family/patient expects  to be discharged to:: Private residence Living Arrangements: Other relatives(Brother) Available Help at Discharge: Family, Available PRN/intermittently Type of Home: House Home Access: Stairs to enter Secretary/administrator of Steps: 2 Entrance Stairs-Rails: None Home Layout: One level Firefighter: Standard Home Equipment: None  Lives With: Other (Comment)(brother)   Functional History: Prior Function Level of Independence: Independent Comments: ADLs, IADLs, and driving   Functional Status:  Mobility: Bed Mobility Overal bed mobility: Needs Assistance Bed Mobility: Sit to Supine, Supine to Sit Supine to sit: Min guard, HOB elevated Sit to supine: Min assist General bed mobility comments: Min A for managing BLEs. Poor attention Transfers Overall transfer level: Needs assistance Equipment used: 2 person hand held assist Transfers: Sit to/from Stand, Stand Pivot Transfers Sit to Stand: +2 physical assistance, Mod assist Stand pivot transfers: Mod assist, +2 physical assistance Squat pivot transfers: Total assist, +2 physical assistance General transfer comment: Mod A +2 for power up into standing and then Mod A +2 for pivot to [facility]. Pt with poor awareness and requring increased cues for hand placement   ADL: ADL Overall ADL's : Needs assistance/impaired Eating/Feeding: Set up, Supervision/ safety, Sitting Grooming: Set up, Supervision/safety, Bed level Upper Body Bathing: Minimal assistance, Sitting Lower Body Bathing: Moderate assistance, Sit to/from stand Upper Body Dressing : Minimal assistance, Sitting Lower Body Dressing: Moderate assistance, Sit to/from stand Lower Body Dressing Details (indicate cue type and reason): Pt able to bring left ankles to R knees for doffing sock and brings right leg to EOB. Pt requiring Mod A for sit<>stand and safety Toilet Transfer: Moderate assistance, +2 for physical assistance, Stand-pivot, BSC Toilet Transfer Details (indicate cue  type and reason): Mod A+2 for sit<>Stand then pivot to [facility] Toileting- Clothing Manipulation and Hygiene: Maximal assistance, Sit to/from stand Toileting - Clothing Manipulation Details (indicate cue type and reason): Pt with difficulty sequencing  Functional mobility during ADLs: Moderate assistance, +2 for physical assistance General ADL Comments: Pt with decreased cognition, balance, and activity tolerance   Cognition: Cognition Overall Cognitive Status: Impaired/Different from baseline Arousal/Alertness: Awake/alert Orientation Level: Oriented to person, Oriented to place, Disoriented to time, Disoriented to situation Attention: Focused Focused Attention: Impaired Focused Attention Impairment: Verbal basic Memory: Impaired Memory Impairment: Storage deficit, Decreased recall of new information Awareness: Impaired Awareness Impairment: Intellectual impairment Problem Solving: Impaired Problem Solving Impairment: Verbal basic Behaviors: Restless Safety/Judgment: Impaired Cognition Arousal/Alertness: Awake/alert Behavior During Therapy: Flat affect, Impulsive Overall Cognitive Status: Impaired/Different from baseline Area of Impairment: Orientation, Attention, Memory, Following commands, Safety/judgement, Awareness, Problem solving Orientation Level: Disoriented to, Situation Current Attention Level: Focused Memory: Decreased short-term memory Following Commands: Follows one step commands inconsistently Safety/Judgement: Decreased awareness of deficits Awareness: Intellectual Problem Solving: Slow processing, Difficulty sequencing, Decreased initiation, Requires verbal cues General Comments: Pt able to state she was at "[place]" but unable to state why. Also reporting her full name, DOB, and current month. Pt with poor problem solving and pcoessing requiring increased cues and time throughout session. During toielting, pt unable to problem solve how to perform toilet hygiene and  required increased assistance. Pt with increased impulsivity, requiring redirection to prevent from getting out of bed/chair every 5-10 seconds   Physical Exam: Blood pressure (!) 160/99, pulse 99, temperature 99.1 F (  37.3 C), temperature source Oral, resp. rate 15, height 5\' 4"  (1.626 m), weight 93.7 kg, SpO2 97 %. Physical Exam  Constitutional: No distress.  obese  HENT:  Head: Normocephalic and atraumatic.  Eyes: Pupils are equal, round, and reactive to light. EOM are normal.  Neck: Normal range of motion. No tracheal deviation present. No thyromegaly present.  Cardiovascular: Normal rate and regular rhythm. Exam reveals no friction rub.  No murmur heard. Respiratory: Effort normal. No respiratory distress. She has no wheezes. She has no rales.  GI: Soft. She exhibits no distension. There is no tenderness.  Musculoskeletal: She exhibits no edema or deformity.  Neurological: She is alert.  Patient is alert. Oriented to person, place. Limited insight and awareness. Follows basic commands. UE motor 4/5. LE: 3/5 HF, KE and 4/5 ADF/PF. Senses pain and light touch in all 4's.   Skin: Skin is warm and dry. She is not diaphoretic.  Psychiatric:  Flat, cooperative      Lab Results Last 48 Hours        Results for orders placed or performed during the hospital encounter of 05/12/18 (from the past 48 hour(s))  Glucose, capillary     Status: None    Collection Time: 05/15/18  8:28 AM  Result Value Ref Range    Glucose-Capillary 97 70 - 99 mg/dL    Comment 1 Notify RN      Comment 2 Document in Chart    Heparin level (unfractionated)     Status: Abnormal    Collection Time: 05/15/18 12:43 PM  Result Value Ref Range    Heparin Unfractionated <0.10 (L) 0.30 - 0.70 IU/mL      Comment: (NOTE) If heparin results are below expected values, and patient dosage has  been confirmed, suggest follow up testing of antithrombin III levels. Performed at [facility], [address],  [place], [place] [address]    CBC     Status: None    Collection Time: 05/16/18  1:50 AM  Result Value Ref Range    WBC 7.5 4.0 - 10.5 K/uL    RBC 4.50 3.87 - 5.11 MIL/uL    Hemoglobin 12.8 12.0 - 15.0 g/dL    HCT 60.4 54.0 - 98.1 %    MCV 90.7 80.0 - 100.0 fL    MCH 28.4 26.0 - 34.0 pg    MCHC 31.4 30.0 - 36.0 g/dL    RDW 19.1 47.8 - 29.5 %    Platelets 163 150 - 400 K/uL    nRBC 0.0 0.0 - 0.2 %      Comment: Performed at [facility], [address] [place], [place] [address]  Basic metabolic panel     Status: Abnormal    Collection Time: 05/16/18  1:50 AM  Result Value Ref Range    Sodium 139 135 - 145 mmol/L    Potassium 2.9 (L) 3.5 - 5.1 mmol/L    Chloride 99 98 - 111 mmol/L    CO2 30 22 - 32 mmol/L    Glucose, Bld 118 (H) 70 - 99 mg/dL    BUN 12 8 - 23 mg/dL    Creatinine, Ser 8.65 (H) 0.44 - 1.00 mg/dL    Calcium 8.8 (L) 8.9 - 10.3 mg/dL    GFR calc non Af Amer 51 (L) >60 mL/min    GFR calc Af Amer 59 (L) >60 mL/min    Anion gap 10 5 - 15      Comment: Performed  at [facility], [address] [place], [place] [address]  Magnesium     Status: None    Collection Time: 05/16/18  1:50 AM  Result Value Ref Range    Magnesium 1.7 1.7 - 2.4 mg/dL      Comment: Performed at [facility], [address] [place], [place] [address]  Basic metabolic panel     Status: Abnormal    Collection Time: 05/17/18  2:14 AM  Result Value Ref Range    Sodium 140 135 - 145 mmol/L    Potassium 3.1 (L) 3.5 - 5.1 mmol/L    Chloride 98 98 - 111 mmol/L    CO2 27 22 - 32 mmol/L    Glucose, Bld 116 (H) 70 - 99 mg/dL    BUN 10 8 - 23 mg/dL    Creatinine, Ser 0.981.13 (H) 0.44 - 1.00 mg/dL    Calcium 8.8 (L) 8.9 - 10.3 mg/dL    GFR calc non Af Amer 49 (L) >60 mL/min    GFR calc Af Amer 56 (L) >60 mL/min    Anion gap 15 5 - 15      Comment: Performed at [facility], [address] [place], [place] [address]      Imaging Results (Last 48 hours)  No results found.            Medical Problem List and Plan: 1.  Decreased functional mobility secondary to  Encephalopathy secondary to hypoxic event due to potential unintentional drug overdose 2.  DVT Prophylaxis/Anticoagulation:  Eliquis 3. Pain Management/chronic back pain:  Tylenol as needed. Patient had been maintained on Neurontin and Percocet prior to admission for chronic back pain discontinued due to encephalopathy. Patient did receive Narcan. 4. Mood:  Provide emotional support as needed 5. Neuropsych: This patient is not capable of making decisions on her own behalf. 6. Skin/Wound Care:  Routine skin checks 7. Fluids/Electrolytes/Nutrition:  Routine ins and outs with follow-up chemistries. Encourage po 8. A. Fib with RVR. Cardiac rate controlled. Elevated troponin felt to be related to demand ischemia. Continue Eliquis 9. Hypertension. HCTZ 12.5 mg daily, Toprol 100 mg daily. Monitor with increased mobility 10. Hyperthyroidism. Continue Tapazole 11. Acute kidney injury. Follow-up chemistries. Baseline creatinine 1.0 12. Acute left leg pain. X-rays and imaging revealed possible subtle nondisplaced left femoral neck fracture cannot be excluded. May need to pursue CT scan if any further complaints 13. Hyperlipidemia. Lipitor/Zetia     Post Admission Physician Evaluation: 1. 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 2. Patient is admitted to receive collaborative, interdisciplinary care between the physiatrist, rehab nursing staff, and therapy team. 3. Patient's level of medical complexity and substantial therapy needs in context of that medical necessity cannot be provided at a lesser intensity of care such as a SNF. 4. Patient has experienced substantial functional loss from his/her baseline which was documented above under the "Functional History" and "Functional Status" headings.  Judging by the patient's diagnosis, physical exam, and functional history, the patient has potential for  functional progress which will result in measurable gains while on inpatient rehab.  These gains will be of substantial and practical use upon discharge  in facilitating mobility and self-care at the household level. 5. Physiatrist will provide 24 hour management of medical needs as well as oversight of the therapy plan/treatment and provide guidance as appropriate regarding the interaction of the two. 6. The Preadmission Screening has been reviewed and patient status is unchanged unless otherwise  stated above. 7. 24 hour rehab nursing will assist with bladder management, bowel management, safety, skin/wound care, disease management, medication administration, pain management and patient education  and help integrate therapy concepts, techniques,education, etc. 8. PT will assess and treat for/with: Lower extremity strength, range of motion, stamina, balance, functional mobility, safety, adaptive techniques and equipment, NMR, family education.   Goals are: supervision to min assist. 9. OT will assess and treat for/with: ADL's, functional mobility, safety, upper extremity strength, adaptive techniques and equipment, NMR, family ed.   Goals are: supervision to min assist. Therapy may proceed with showering this patient. 10. SLP will assess and treat for/with: cognition, communication, family ed.  Goals are: min assist. 11. Case Management and Social Worker will assess and treat for psychological issues and discharge planning. 12. Team conference will be held weekly to assess progress toward goals and to determine barriers to discharge. 13. Patient will receive at least 3 hours of therapy per day at least 5 days per week. 14. ELOS: 16-20 days       15. Prognosis:  excellent     I have personally performed a face to face diagnostic evaluation of this patient and formulated the key components of the plan.  Additionally, I have personally reviewed laboratory data, imaging studies, as well as relevant notes  and concur with the physician assistant's documentation above.   Ranelle Oyster, MD, [place] [facility]     Mcarthur Rossetti Angiulli, PA-C 05/17/2018   The patient's status has not changed. The original post admission physician evaluation remains appropriate, and any changes from the pre-admission screening or documentation from the acute chart are noted above.  Ranelle Oyster, MD 05/17/2018

## 2018-05-17 NOTE — Progress Notes (Signed)
Physical Medicine and Rehabilitation Consult Reason for Consult: Decreased functional mobility Referring Physician: [facility]   HPI: Stephanie Hahn is a 72 y.o. female with history of chronic back pain maintained on Neurontin and Percocet, CAD, hypertension, hyperlipidemia. Per chart review and patient, patient lives alone independent prior to admission. One level home with 2 steps to entry.patient initially. Family assistance as needed. [facility] after being found down poorly responsive and hypoxic. Per EMS report saturating in the 50s with poor respiratory effort. Patient did respond to Narcan.patient noted to be in atrial fibrillation with RVR placed on heparin. [facility]. UDS positive for opiates and benzos. Chemistry panel creatinine 2.40 from baseline 1.0,CK 240, WBC 17,000, lactic acid reported to be normal, troponin 1.78-3.7. Cranial CT scan reviewed, unremarkable for acute intracranial process. Chest x-ray showed no focal infiltrate. Unilateral pelvic films showed a very subtle nondisplaced left femoral neck fracture could not be excluded.no evidence of dislocation. Follow-up MRI bilateral globus pallidus restricted diffusion reflecting possible acute injury from hypoxia, metabolic disturbance. Additional scattered punctate foci of restricted diffusion in both cerebral hemispheres suggesting acute embolic infarctions.Carotid Dopplers with 40-59% left ICA stenosis. EEG indicated of severe encephalopathy. Elevated troponin felt to be related to demand ischemia. Echocardiogram with ejection fraction of 65% grade 2 diastolic dysfunction. Neurology follow-up presently maintained on Eliquis for CVA prophylaxis and for noted atrial fibrillation. Tolerating a regular diet. Therapy evaluations completed with recommendations of physical medicine rehabilitation consult.   Review of Systems  Constitutional: Negative for chills and  fever.  HENT: Negative for hearing loss.   Eyes: Negative for blurred vision and double vision.  Respiratory: Negative for shortness of breath.   Gastrointestinal: Positive for constipation. Negative for nausea and vomiting.       GERD  Genitourinary: Negative for dysuria, flank pain and hematuria.  Musculoskeletal: Positive for back pain, joint pain, myalgias and neck pain.  Skin: Negative for rash.  Neurological: Positive for dizziness and headaches.  Psychiatric/Behavioral: Positive for depression.  All other systems reviewed and are negative.  History reviewed. No pertinent past medical history., unable to obtain accureately from patient. History reviewed. No pertinent surgical history., unable to obtain accureately from patient.      Family History  Problem Relation Age of Onset  . Alzheimer's disease Other   . Alzheimer's disease Other    Social History:  has no tobacco, alcohol, and drug history on file., unable to obtain accureately from patient. Allergies: No Known Allergies       Medications Prior to Admission  Medication Sig Dispense Refill  . atorvastatin (LIPITOR) 80 MG tablet Take 80 mg by mouth daily.  1  . buPROPion (WELLBUTRIN XL) 150 MG 24 hr tablet Take 150 mg by mouth every morning.  6  . gabapentin (NEURONTIN) 300 MG capsule Take 300 mg by mouth 2 (two) times daily.    . methimazole (TAPAZOLE) 10 MG tablet Take 10 mg by mouth daily.  0  . metoprolol succinate (TOPROL-XL) 100 MG 24 hr tablet Take 100 mg by mouth daily.  1  . nitroGLYCERIN (NITROSTAT) 0.4 MG SL tablet Place 0.4 mg under the tongue every 5 (five) minutes as needed for chest pain.  3  . omeprazole (PRILOSEC) 40 MG capsule Take 40 mg by mouth daily.  6  . oxyCODONE-acetaminophen (PERCOCET/ROXICET) 5-325 MG tablet Take 1 tablet by mouth every 6 (six) hours as needed (pain).   0    Home: Home  Living Family/patient expects to be discharged to:: Private residence Living Arrangements:  Other relatives(Brother) Available Help at Discharge: Family, Available PRN/intermittently Type of Home: House Home Access: Stairs to enter Secretary/administrator of Steps: 2 Entrance Stairs-Rails: None Home Layout: One level Firefighter: Standard Home Equipment: None  Lives With: Other (Comment)(brother)  Functional History: Prior Function Level of Independence: Independent Comments: ADLs, IADLs, and driving Functional Status:  Mobility: Bed Mobility Overal bed mobility: Needs Assistance Bed Mobility: Sit to Supine, Supine to Sit Supine to sit: Min guard, HOB elevated Sit to supine: Min assist General bed mobility comments: Min A for managing BLEs. Poor attention Transfers Overall transfer level: Needs assistance Equipment used: 2 person hand held assist Transfers: Sit to/from Stand, Stand Pivot Transfers Sit to Stand: +2 physical assistance, Mod assist Stand pivot transfers: Mod assist, +2 physical assistance Squat pivot transfers: Total assist, +2 physical assistance General transfer comment: Mod A +2 for power up into standing and then Mod A +2 for pivot to [facility]. Pt with poor awareness and requring increased cues for hand placement  ADL: ADL Overall ADL's : Needs assistance/impaired Eating/Feeding: Set up, Supervision/ safety, Sitting Grooming: Set up, Supervision/safety, Bed level Upper Body Bathing: Minimal assistance, Sitting Lower Body Bathing: Moderate assistance, Sit to/from stand Upper Body Dressing : Minimal assistance, Sitting Lower Body Dressing: Moderate assistance, Sit to/from stand Lower Body Dressing Details (indicate cue type and reason): Pt able to bring left ankles to R knees for doffing sock and brings right leg to EOB. Pt requiring Mod A for sit<>stand and safety Toilet Transfer: Moderate assistance, +2 for physical assistance, Stand-pivot, BSC Toilet Transfer Details (indicate cue type and reason): Mod A+2 for sit<>Stand then pivot to  [facility] Toileting- Clothing Manipulation and Hygiene: Maximal assistance, Sit to/from stand Toileting - Clothing Manipulation Details (indicate cue type and reason): Pt with difficulty sequencing  Functional mobility during ADLs: Moderate assistance, +2 for physical assistance General ADL Comments: Pt with decreased cognition, balance, and activity tolerance  Cognition: Cognition Overall Cognitive Status: Impaired/Different from baseline Arousal/Alertness: Awake/alert Orientation Level: Oriented to person, Disoriented to place, Disoriented to time, Disoriented to situation Attention: Focused Focused Attention: Impaired Focused Attention Impairment: Verbal basic Memory: Impaired Memory Impairment: Storage deficit, Decreased recall of new information Awareness: Impaired Awareness Impairment: Intellectual impairment Problem Solving: Impaired Problem Solving Impairment: Verbal basic Behaviors: Restless Safety/Judgment: Impaired Cognition Arousal/Alertness: Awake/alert Behavior During Therapy: Flat affect, Impulsive Overall Cognitive Status: Impaired/Different from baseline Area of Impairment: Orientation, Attention, Memory, Following commands, Safety/judgement, Awareness, Problem solving Orientation Level: Disoriented to, Situation Current Attention Level: Focused Memory: Decreased short-term memory Following Commands: Follows one step commands inconsistently Safety/Judgement: Decreased awareness of deficits Awareness: Intellectual Problem Solving: Slow processing, Difficulty sequencing, Decreased initiation, Requires verbal cues General Comments: Pt able to state she was at "[place]" but unable to state why. Also reporting her full name, DOB, and current month. Pt with poor problem solving and pcoessing requiring increased cues and time throughout session. During toielting, pt unable to problem solve how to perform toilet hygiene and required increased assistance. Pt with increased  impulsivity, requiring redirection to prevent from getting out of bed/chair every 5-10 seconds  Blood pressure (!) 155/78, pulse 81, temperature 98.7 F (37.1 C), temperature source Oral, resp. rate (!) 21, height 5\' 4"  (1.626 m), weight 93.7 kg, SpO2 95 %. Physical Exam  Vitals reviewed. Constitutional: She appears well-developed.  Obese  HENT:  Head: Normocephalic and atraumatic.  Right Ear: External ear normal.  Left Ear: External  ear normal.  Eyes: EOM are normal. Right eye exhibits no discharge. Left eye exhibits no discharge.  Neck: Normal range of motion. Neck supple. No thyromegaly present.  Cardiovascular: Normal rate and regular rhythm.  Respiratory: Effort normal and breath sounds normal. No respiratory distress.  GI: Soft. Bowel sounds are normal. She exhibits no distension.  Musculoskeletal:  No edema or tenderness in extremities  Neurological: She is alert.  Speech was clear.  Provides her name and place.  Follow simple commands.  She did have some decrease in attention. Motor:Grossly 4+/5 throughout, ?Left side weaker than right  Skin: Skin is warm and dry.  Psychiatric: Her affect is blunt. Her speech is delayed. She is slowed.    LabResultsLast24Hours  Results for orders placed or performed during the hospital encounter of 05/12/18 (from the past 24 hour(s))  Glucose, capillary     Status: None   Collection Time: 05/15/18  8:28 AM  Result Value Ref Range   Glucose-Capillary 97 70 - 99 mg/dL   Comment 1 Notify RN    Comment 2 Document in Chart   Heparin level (unfractionated)     Status: Abnormal   Collection Time: 05/15/18 12:43 PM  Result Value Ref Range   Heparin Unfractionated <0.10 (L) 0.30 - 0.70 IU/mL  CBC     Status: None   Collection Time: 05/16/18  1:50 AM  Result Value Ref Range   WBC 7.5 4.0 - 10.5 K/uL   RBC 4.50 3.87 - 5.11 MIL/uL   Hemoglobin 12.8 12.0 - 15.0 g/dL   HCT 43.3 29.5 - 18.8 %   MCV 90.7 80.0 - 100.0 fL     MCH 28.4 26.0 - 34.0 pg   MCHC 31.4 30.0 - 36.0 g/dL   RDW 41.6 60.6 - 30.1 %   Platelets 163 150 - 400 K/uL   nRBC 0.0 0.0 - 0.2 %  Basic metabolic panel     Status: Abnormal   Collection Time: 05/16/18  1:50 AM  Result Value Ref Range   Sodium 139 135 - 145 mmol/L   Potassium 2.9 (L) 3.5 - 5.1 mmol/L   Chloride 99 98 - 111 mmol/L   CO2 30 22 - 32 mmol/L   Glucose, Bld 118 (H) 70 - 99 mg/dL   BUN 12 8 - 23 mg/dL   Creatinine, Ser 6.01 (H) 0.44 - 1.00 mg/dL   Calcium 8.8 (L) 8.9 - 10.3 mg/dL   GFR calc non Af Amer 51 (L) >60 mL/min   GFR calc Af Amer 59 (L) >60 mL/min   Anion gap 10 5 - 15      ImagingResults(Last48hours)  Vas US Carotid (at [facility])  Result Date: 05/14/2018 Carotid Arterial Duplex Study Indications: CVA. Limitations: patient movement Performing Technologist: Farrel Demark RDMS, RVT  Examination Guidelines: A complete evaluation includes B-mode imaging, spectral Doppler, color Doppler, and power Doppler as needed of all accessible portions of each vessel. Bilateral testing is considered an integral part of a complete examination. Limited examinations for reoccurring indications may be performed as noted.  Right Carotid Findings: +----------+--------+--------+--------+-------------------------+--------+           PSV cm/sEDV cm/sStenosisDescribe                 Comments +----------+--------+--------+--------+-------------------------+--------+ CCA Prox  75      14              heterogenous                      +----------+--------+--------+--------+-------------------------+--------+  CCA Distal98      19                                                +----------+--------+--------+--------+-------------------------+--------+ ICA Prox  118     32      1-39%   heterogenous and calcific         +----------+--------+--------+--------+-------------------------+--------+ ICA Distal49      15                                                 +----------+--------+--------+--------+-------------------------+--------+ ECA       121     9                                                 +----------+--------+--------+--------+-------------------------+--------+ +----------+--------+-------+----------------+-------------------+           PSV cm/sEDV cmsDescribe        Arm Pressure (mmHG) +----------+--------+-------+----------------+-------------------+ [id number]            Multiphasic, WNL                    +----------+--------+-------+----------------+-------------------+ +---------+--------+--+--------+-+---------+ VertebralPSV cm/s24EDV cm/s7Antegrade +---------+--------+--+--------+-+---------+  Left Carotid Findings: +----------+--------+--------+--------+-------------------------+--------+           PSV cm/sEDV cm/sStenosisDescribe                 Comments +----------+--------+--------+--------+-------------------------+--------+ CCA Prox  93      22              heterogenous                      +----------+--------+--------+--------+-------------------------+--------+ CCA Distal82      16                                                +----------+--------+--------+--------+-------------------------+--------+ ICA Prox  187     56      40-59%  heterogenous and calcific         +----------+--------+--------+--------+-------------------------+--------+ ICA Mid   150     32                                                +----------+--------+--------+--------+-------------------------+--------+ ICA Distal63      16                                                +----------+--------+--------+--------+-------------------------+--------+ ECA       77      12                                                +----------+--------+--------+--------+-------------------------+--------+ +----------+--------+--------+----------------+-------------------+  SubclavianPSV  cm/sEDV cm/sDescribe        Arm Pressure (mmHG) +----------+--------+--------+----------------+-------------------+           141             Multiphasic, WNL                    +----------+--------+--------+----------------+-------------------+ +---------+--------+--+--------+--+---------+ VertebralPSV cm/s59EDV cm/s14Antegrade +---------+--------+--+--------+--+---------+  Summary: Right Carotid: Velocities in the right ICA are consistent with a 1-39% stenosis.                Upper end of range. Left Carotid: Velocities in the left ICA are consistent with a 40-59% stenosis.               Mid to upper end of range. Vertebrals: Bilateral vertebral arteries demonstrate antegrade flow. *See table(s) above for measurements and observations.  Electronically signed by Delia Heady MD on 05/14/2018 at 1:54:23 PM.    Final      Assessment/Plan: Diagnosis: Encephalopathy Labs and images (see above) independently reviewed.  Records reviewed and summated above.  1. Does the need for close, 24 hr/day medical supervision in concert with the patient's rehab needs make it unreasonable for this patient to be served in a less intensive setting? Yes  2. Co-Morbidities requiring supervision/potential complications: diastolic dysfunction (monitor for signs and symptoms of fluid overload), atrial fibrillation with RVR (continue meds, monitor heart rate with increased physical activity), chronic back pain (Biofeedback training with therapies to help reduce reliance on opiate pain medications, monitor pain control during therapies, and sedation at rest and titrate to maximum efficacy to ensure participation and gains in therapies), CAD, HTN with hypertensive crisis (monitor and provide prns in accordance with increased physical exertion and pain), Hyperlipidemia, hypokalemia (continue to monitor and replete as necessary), AKI (avoid nephrotoxic meds) 3. Due to safety, disease management, medication  administration, pain management and patient education, does the patient require 24 hr/day rehab nursing? Yes 4. Does the patient require coordinated care of a physician, rehab nurse, PT (1-2 hrs/day, 5 days/week), OT (1-2 hrs/day, 5 days/week) and SLP (1-2 hrs/day, 5 days/week) to address physical and functional deficits in the context of the above medical diagnosis(es)? Yes Addressing deficits in the following areas: balance, endurance, locomotion, strength, transferring, bathing, dressing, toileting, cognition and psychosocial support 5. Can the patient actively participate in an intensive therapy program of at least 3 hrs of therapy per day at least 5 days per week? Yes 6. The potential for patient to make measurable gains while on inpatient rehab is excellent 7. Anticipated functional outcomes upon discharge from inpatient rehab are supervision and min assist  with PT, supervision and min assist with OT, min assist with SLP. 8. Estimated rehab length of stay to reach the above functional goals is: 16-20 days. 9. Anticipated D/C setting: Home 10. Anticipated post D/C treatments: HH therapy and Home excercise program 11. Overall Rehab/Functional Prognosis: excellent and good  RECOMMENDATIONS: This patient's condition is appropriate for continued rehabilitative care in the following setting: CIR if caregiver support available upon discharge and patient willing when medically stable.  Patient currently states she wants to go home; encouraged discussion with family. Patient has agreed to participate in recommended program. Potentially Note that insurance prior authorization may be required for reimbursement for recommended care.  Comment: Rehab Admissions Coordinator to follow up.   I have personally performed a face to face diagnostic evaluation, including, but not limited to relevant history and physical exam findings, of this patient and  developed relevant assessment and plan.  Additionally,  I have reviewed and concur with the physician assistant's documentation above.   Stephanie Morrow, MD, ABPMR Mcarthur Rossetti Angiulli, PA-C 05/16/2018        Revision History                        Routing History

## 2018-05-17 NOTE — Discharge Instructions (Signed)

## 2018-05-17 NOTE — Progress Notes (Signed)
Discharged to CIR by wheelchair, belongings and record given to staff. Report given to RN.

## 2018-05-17 NOTE — Plan of Care (Signed)
  Problem: Clinical Measurements: Goal: Ability to maintain clinical measurements within normal limits will improve Outcome: Progressing Goal: Diagnostic test results will improve Outcome: Progressing   Problem: Activity: Goal: Risk for activity intolerance will decrease Outcome: Progressing   Problem: Nutrition: Goal: Adequate nutrition will be maintained Outcome: Progressing   Problem: Elimination: Goal: Will not experience complications related to bowel motility Outcome: Progressing   Problem: Safety: Goal: Ability to remain free from injury will improve Outcome: Progressing   Problem: Self-Care: Goal: Ability to participate in self-care as condition permits will improve Outcome: Progressing Goal: Verbalization of feelings and concerns over difficulty with self-care will improve Outcome: Progressing Goal: Ability to communicate needs accurately will improve Outcome: Progressing

## 2018-05-17 NOTE — Discharge Summary (Signed)
Physician Discharge Summary  Stephanie PebblesSara Hahn ZOX:[id number] DOB: 11-07-1945 DOA: 05/12/2018  PCP: Barron AlvineGibson, David, MD  Admit date: 05/12/2018 Discharge date: 05/17/2018  Admitted From: Home Disposition:  CIR   Recommendations for Outpatient Follow-up:  1. Follow up with PCP in 1 week 2. Follow up with Neurology in 6 weeks   Brief/Interim Summary: Stephanie Hahn is a 72 y.o.femalewith medical history significant forchronic back pain, hyperthyroidism, coronary artery disease, hypertension, and depression, who [facility] after she was found on the floor at home, poorly responsive and hypoxic. Family is at the bedside and assist with the history. It had been approximately 24 hours since the family member had spoken with her on the phone, she did not return calls on the day of presentation, and when family went to check on her, she was found on the floor poorly responsive. She was saturating in the 50s on EMS arrival with poor respiratory effort and poor responsiveness. She responded to Narcan, had seizure-like activity that resolved with Versed, and was brought into the ED. Upon arrival to the ED, patient is found to be obtunded with poor respiratory effort, again responding well to Narcan, afebrile, tachycardic, with stable blood pressure, and saturating adequately on room air after Narcan administration. EKG features sinus tachycardia with rate 113 and PVCs. Noncontrast head CT was negative for acute intracranial abnormality. Chest x-ray is notable for cardiomegaly, peribronchial thickening, and interstitial prominence, possibly suggestive of early interstitial edema. Chemistry panel is notable for a creatinine of 2.40, up from a baseline of 1.0. Ethanol level is undetectable and TSH was normal. CBC features a leukocytosis to 17,100 with normal H&H and normal platelets. Lactic acid was reassuringly normal, INR normal and urinalysis unremarkable. Troponin  was elevated at 1.78, increased further to 3.0, and then 3.7 while in the ED. Patient received Narcan multiple times, Versed, 1 L of normal saline, duo nebs, cefepime, vancomycin, and Flagyl in the ED. She was also given 125 mg of IV Solu-Medrol and started on a heparin infusion. She [facility] and management. Work up revealed new A Fib RVR, embolic stroke. She was evaluated by neurology and worked up for stroke. She was started on Eliquis.   Discharge Diagnoses:  Principal Problem:   Overdose opiate, accidental or unintentional, subsequent encounter Active Problems:   NSTEMI (non-ST elevated myocardial infarction) (HCC)   Aspiration pneumonia (HCC)   Chronic pain   Acute encephalopathy   Seizure-like activity (HCC)   Opiate overdose (HCC)   Left leg pain   Essential hypertension   CAD (coronary artery disease)   Cerebral embolism with cerebral infarction   Atrial fibrillation with rapid ventricular response (HCC)   Hypertensive crisis   AKI (acute kidney injury) (HCC)   Hypokalemia   Acute metabolic encephalopathy -Differential diagnosis with opioid overdose, as her encephalopathy improved with Narcan administration. UDS positive for opiates and benzo  -CT head negative  -MRI brain: Bilateral globus pallidus restricted diffusion which may reflect acute injury from hypoxia, toxic exposure, or metabolic disturbance -EEG: The patient iscomatose and not responding, throughout the recording bilateral sharp waves were noted that built up at times and became rhythmic but no ictal phase was noticed in the recording. Diffuse triphasic waves were also noted that might represent severe encephalopathy. -Slowly improving  Embolic CVA -Neurology consulted  -Echo - EF 65%, grade 2 dd, no patent foramen ovale  -Carotid US - unremarkable  -PT OT  SLP - rec CIR, consult placed   A Fib RVR -Now normal sinus rhythm -Continue toprol   -Eliquis    Acute hypoxic respiratory failure -Improved with Narcan -Chest x-ray reviewed independently, no focal consolidation noted.  Stop Unasyn -Now on room air  NSTEMI type II -Likely demand ischemia in setting of hypoxic event -Troponin peaked at 3.7 and trended downward to 1.56  -EKG reviewed independently which revealed normal sinus rhythm without ST change  -Patient denies any chest pain -Echocardiogram without wall motion abnormalities   Seizure-like activity -Episode of seizure-like activity in the emergency department that resolved with Versed -Continue seizure precaution -EEG: The patient iscomatose and not responding, throughout the recording bilateral sharp waves were noted that built up at times and became rhythmic but no ictal phase was noticed in the recording. Diffuse triphasic waves were also noted that might represent severe encephalopathy.  Acute kidney injury -Baseline creatinine 1 -Stable back to baseline   Hyperthyroidism -Continue Tapazole, metoprolol  HTN -Continue metoprolol, start HCTZ for better BP control   HLD -Continue lipitor   Chronic pain -Patient has chronic back pain managed at home with Percocet -No pain currently  Acute left leg pain -Imaging of the left lower extremity without acute fracture, although hip x-ray reveals very subtle nondisplaced left femoral neck fracture cannot be excluded. May pursue CT scan if patient has any complaints of leg pain  Hypokalemia -Replace, trend   Discharge Instructions   Allergies as of 05/17/2018   No Known Allergies     Medication List    STOP taking these medications   gabapentin 300 MG capsule Commonly known as:  NEURONTIN   oxyCODONE-acetaminophen 5-325 MG tablet Commonly known as:  PERCOCET/ROXICET     TAKE these medications   apixaban 5 MG Tabs tablet Commonly known as:  ELIQUIS Take 1 tablet (5 mg total) by mouth 2 (two) times daily.   atorvastatin 80 MG  tablet Commonly known as:  LIPITOR Take 80 mg by mouth daily.   buPROPion 150 MG 24 hr tablet Commonly known as:  WELLBUTRIN XL Take 150 mg by mouth every morning.   ezetimibe 10 MG tablet Commonly known as:  ZETIA Take 1 tablet (10 mg total) by mouth daily. Start taking on:  05/18/2018   hydrochlorothiazide 12.5 MG capsule Commonly known as:  MICROZIDE Take 1 capsule (12.5 mg total) by mouth daily. Start taking on:  05/18/2018   methimazole 10 MG tablet Commonly known as:  TAPAZOLE Take 10 mg by mouth daily.   metoprolol succinate 100 MG 24 hr tablet Commonly known as:  TOPROL-XL Take 100 mg by mouth daily.   nitroGLYCERIN 0.4 MG SL tablet Commonly known as:  NITROSTAT Place 0.4 mg under the tongue every 5 (five) minutes as needed for chest pain.   omeprazole 40 MG capsule Commonly known as:  PRILOSEC Take 40 mg by mouth daily.       No Known Allergies  Consultations: Neurology   Procedures/Studies: Ct Head Wo Contrast  Result Date: 05/13/2018 CLINICAL DATA:  Initial evaluation for acute encephalopathy, found down. EXAM: CT HEAD WITHOUT CONTRAST TECHNIQUE: Contiguous axial images were obtained from the base of the skull through the vertex without intravenous contrast. COMPARISON:  None available. FINDINGS: Brain: Examination mildly degraded by motion artifact. Cerebral volume normal for age. No acute intracranial hemorrhage. No acute large vessel territory infarct. No mass lesion, midline shift or mass effect. No hydrocephalus. No extra-axial fluid collection. Vascular: No hyperdense vessel. Calcified atherosclerosis at the  skull base. Skull: Scalp soft tissues and calvarium within normal limits. Sinuses/Orbits: Globes and orbital soft tissues demonstrate no acute finding. Paranasal sinuses and mastoid air cells are clear. Other: None. IMPRESSION: Negative head CT.  No acute intracranial abnormality identified. Electronically Signed   By: Rise Mu M.D.   On:  05/13/2018 02:08   Mr Brain Wo Contrast  Result Date: 05/13/2018 CLINICAL DATA:  Found down at home poorly responsive and hypoxic. Seizure like activity. Atrial fibrillation and possible opiate overdose. EXAM: MRI HEAD WITHOUT CONTRAST TECHNIQUE: Multiplanar, multiecho pulse sequences of the brain and surrounding structures were obtained without intravenous contrast. COMPARISON:  Head CT 05/13/2018 FINDINGS: Brain: There are symmetric subcentimeter foci of restricted diffusion and T2 hyperintensity in the globus pallidus bilaterally. Additional small foci of restricted diffusion involve cortex and subcortical white matter in the right parietal lobe as well as mesial left temporal lobe cortex and possibly right frontal white matter at the level of the centrum semiovale. No infarcts are present in the brainstem or cerebellum. No definite intracranial hemorrhage, mass, midline shift, or extra-axial fluid collection is identified. The ventricles and sulci are normal. Scattered small foci of T2 hyperintensity in the cerebral white matter bilaterally are nonspecific but compatible with chronic small vessel ischemic disease. There is a chronic lacunar infarct in the white matter adjacent to the left frontal horn. Dedicated temporal lobe imaging is mildly motion degraded with symmetric volume and signal of the hippocampi. Vascular: Major intracranial vascular flow voids are preserved. Skull and upper cervical spine: No suspicious marrow lesion. Disc degeneration at C3-4 and C4-5 with suspected spinal stenosis but no gross cord compression. Sinuses/Orbits: Bilateral cataract extraction. Clear paranasal sinuses. Trace right mastoid effusion. Other: None. IMPRESSION: 1. Bilateral globus pallidus restricted diffusion which may reflect acute injury from hypoxia, toxic exposure, or metabolic disturbance. 2. Additional scattered punctate foci of restricted diffusion in both cerebral hemispheres suggesting acute embolic  infarcts. 3. Mild chronic small vessel ischemic disease. Electronically Signed   By: Sebastian Ache M.D.   On: 05/13/2018 09:25   Dg Chest Port 1 View  Result Date: 05/13/2018 CLINICAL DATA:  Recently found unresponsive EXAM: PORTABLE CHEST 1 VIEW COMPARISON:  02/08/2016 FINDINGS: Cardiac shadow is mildly prominent but stable. The lungs are well aerated bilaterally. Patchy interstitial markings are noted particularly in the apices which may be related to some underlying bronchitic markings. No sizable infiltrate or effusion is seen. No pneumothorax is noted. Degenerative change of the thoracic spine is noted IMPRESSION: Increased interstitial markings particularly in the apices. No focal confluent infiltrate is noted. Electronically Signed   By: Alcide Clever M.D.   On: 05/13/2018 06:17   Dg Ankle Left Port  Result Date: 05/13/2018 CLINICAL DATA:  Found unresponsive recently with ankle swelling EXAM: PORTABLE LEFT ANKLE - 2 VIEW COMPARISON:  None. 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 Some slight subluxation posteriorly of the talus with respect to the distal tibia is noted. Degenerative changes about the tarsal bones and ankle joint are seen with generalized soft tissue swelling. No acute fracture is noted. IMPRESSION: Chronic appearing changes without acute bony abnormality. Electronically Signed   By: Alcide Clever M.D.   On: 05/13/2018 06:19   Dg Foot 2 Views Left  Result Date: 05/13/2018 CLINICAL DATA:  Recently found unresponsive. Foot swelling is noted. EXAM: LEFT FOOT - 2 VIEW COMPARISON:  None. FINDINGS: Tarsal degenerative changes are noted. Degenerative changes at the first MTP are seen with  hallux valgus deformity. Generalized foot swelling is noted. Some posterior subluxation of the talus with respect to the distal tibia is seen. 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 No prior films are available for comparison  however. 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 Some mild subluxation of the talus with respect to the distal tibia is noted. No acute fracture is seen. Electronically Signed   By: Alcide Clever M.D.   On: 05/13/2018 06:18   Dg Hip Unilat With Pelvis 2-3 Views Left  Result Date: 05/13/2018 CLINICAL DATA:  Left leg pain. EXAM: DG HIP (WITH OR WITHOUT PELVIS) 2-3V LEFT COMPARISON:  Lumbar spine 03/18/2018.  CT abdomen pelvis 11/27/2009. FINDINGS: Probe noted over the rectum. Degenerative changes lumbar spine and left hip. Total right hip replacement. Hardware intact. A very subtle nondisplaced left femoral neck fracture cannot be excluded. No evidence of dislocation. Pelvic calcifications consistent phleboliths. Aortoiliac and peripheral vascular calcification. IMPRESSION: 1. A very subtle nondisplaced left femoral neck fracture cannot be excluded. No evidence of dislocation. 2. Degenerative changes lumbar spine and left hip. Total right hip replacement. Hardware intact. 3.  Aortoiliac and peripheral vascular disease. Electronically Signed   By: Maisie Fus  Register   On: 05/13/2018 06:14   Vas US Carotid (at [facility])  Result Date: 05/14/2018 Carotid Arterial Duplex Study Indications: CVA. Limitations: patient movement Performing Technologist: Farrel Demark RDMS, RVT  Examination Guidelines: A complete evaluation includes B-mode imaging, spectral Doppler, color Doppler, and power Doppler as needed of all accessible portions of each vessel. Bilateral testing is considered an integral part of a complete examination. Limited examinations for reoccurring indications may be performed as noted.  Right Carotid Findings: +----------+--------+--------+--------+-------------------------+--------+           PSV cm/sEDV cm/sStenosisDescribe                 Comments +----------+--------+--------+--------+-------------------------+--------+  CCA Prox  75      14              heterogenous                      +----------+--------+--------+--------+-------------------------+--------+ CCA Distal98      19                                                +----------+--------+--------+--------+-------------------------+--------+ ICA Prox  118     32      1-39%   heterogenous and calcific         +----------+--------+--------+--------+-------------------------+--------+ ICA Distal49      15                                                +----------+--------+--------+--------+-------------------------+--------+ ECA       121     9                                                 +----------+--------+--------+--------+-------------------------+--------+ +----------+--------+-------+----------------+-------------------+           PSV cm/sEDV cmsDescribe  Arm Pressure (mmHG) +----------+--------+-------+----------------+-------------------+ [id number]            Multiphasic, WNL                    +----------+--------+-------+----------------+-------------------+ +---------+--------+--+--------+-+---------+ VertebralPSV cm/s24EDV cm/s7Antegrade +---------+--------+--+--------+-+---------+  Left Carotid Findings: +----------+--------+--------+--------+-------------------------+--------+           PSV cm/sEDV cm/sStenosisDescribe                 Comments +----------+--------+--------+--------+-------------------------+--------+ CCA Prox  93      22              heterogenous                      +----------+--------+--------+--------+-------------------------+--------+ CCA Distal82      16                                                +----------+--------+--------+--------+-------------------------+--------+ ICA Prox  187     56      40-59%  heterogenous and calcific         +----------+--------+--------+--------+-------------------------+--------+ ICA Mid   150     32                                                 +----------+--------+--------+--------+-------------------------+--------+ ICA Distal63      16                                                +----------+--------+--------+--------+-------------------------+--------+ ECA       77      12                                                +----------+--------+--------+--------+-------------------------+--------+ +----------+--------+--------+----------------+-------------------+ SubclavianPSV cm/sEDV cm/sDescribe        Arm Pressure (mmHG) +----------+--------+--------+----------------+-------------------+           141             Multiphasic, WNL                    +----------+--------+--------+----------------+-------------------+ +---------+--------+--+--------+--+---------+ VertebralPSV cm/s59EDV cm/s14Antegrade +---------+--------+--+--------+--+---------+  Summary: Right Carotid: Velocities in the right ICA are consistent with a 1-39% stenosis.                Upper end of range. Left Carotid: Velocities in the left ICA are consistent with a 40-59% stenosis.               Mid to upper end of range. Vertebrals: Bilateral vertebral arteries demonstrate antegrade flow. *See table(s) above for measurements and observations.  Electronically signed by Delia Heady MD on 05/14/2018 at 1:54:23 PM.    Final     Echo Study Conclusions  - Left ventricle: The cavity size was normal. Wall thickness was   increased in a pattern of mild to moderate LVH. Systolic function   was normal. The estimated ejection fraction was 65%. Wall motion   was normal; there were no regional  wall motion abnormalities.   Features are consistent with a pseudonormal left ventricular   filling pattern, with concomitant abnormal relaxation and   increased filling pressure (grade 2 diastolic dysfunction).   Doppler parameters are consistent with high ventricular filling   pressure. - Aortic valve: Mildly  calcified annulus. Trileaflet. - Mitral valve: Mildly calcified annulus. Normal thickness leaflets   . There was mild regurgitation. Valve area by pressure half-time:   1.62 cm^2. - Atrial septum: No defect or patent foramen ovale was identified. - Tricuspid valve: There was mild regurgitation. - Pulmonary arteries: PA peak pressure: 33 mm Hg (S).      Discharge Exam: Vitals:   05/17/18 1132 05/17/18 1610  BP: (!) 158/85   Pulse: (!) 107 91  Resp: 20   Temp: 98.1 F (36.7 C) 99.4 F (37.4 C)  SpO2: 99% 98%    Examination: General exam: Appears calm and comfortable  Respiratory system: Clear to auscultation. Respiratory effort normal. Cardiovascular system: S1 & S2 heard, RRR. No JVD, murmurs, rubs, gallops or clicks. No pedal edema. Gastrointestinal system: Abdomen is nondistended, soft and nontender. No organomegaly or masses felt. Normal bowel sounds heard. Central nervous system: Alert and oriented. No focal neurological deficits. Extremities: Symmetric 5 x 5 power. Skin: No rashes, lesions or ulcers Psychiatry: Judgement and insight appear stable     The results of significant diagnostics from this hospitalization (including imaging, microbiology, ancillary and laboratory) are listed below for reference.     Microbiology: Recent Results (from the past 240 hour(s))  MRSA PCR Screening     Status: None   Collection Time: 05/12/18  9:54 PM  Result Value Ref Range Status   MRSA by PCR NEGATIVE NEGATIVE Final    Comment:        The GeneXpert MRSA Assay (FDA approved for NASAL specimens only), is one component of a comprehensive MRSA colonization surveillance program. It is not intended to diagnose MRSA infection nor to guide or monitor treatment for MRSA infections. Performed at [facility], [address] [place], [place] [address]      Labs: BNP (last 3 results) Recent Labs    05/13/18 0018  BNP 270.0*   Basic Metabolic Panel: Recent Labs   Lab 05/13/18 0018 05/14/18 0158 05/15/18 0236 05/16/18 0150 05/17/18 0214  NA 141 144 139 139 140  K 4.2 3.9 3.6 2.9* 3.1*  CL 104 106 99 99 98  CO2 26 29 24 30 27   GLUCOSE 110* 118* 115* 118* 116*  BUN 28* 16 11 12 10   CREATININE 1.76* 1.10* 1.05* 1.09* 1.13*  CALCIUM 8.6* 8.9 9.4 8.8* 8.8*  MG  --   --   --  1.7  --    Liver Function Tests: Recent Labs  Lab 05/13/18 0018  AST 32  ALT 20  ALKPHOS 76  BILITOT 0.7  PROT 6.5  ALBUMIN 3.3*   No results for input(s): LIPASE, AMYLASE in the last 168 hours. Recent Labs  Lab 05/13/18 0018  AMMONIA 40*   CBC: Recent Labs  Lab 05/13/18 0018 05/14/18 0158 05/15/18 0236 05/16/18 0150  WBC 13.4* 9.0 10.6* 7.5  NEUTROABS 10.4*  --   --   --   HGB 11.4* 11.2* 13.3 12.8  HCT 38.4  33.9* 37.6 43.8 40.8  MCV 96.5 95.9 91.4 90.7  PLT 147* 159 161 163   Cardiac Enzymes: Recent Labs  Lab 05/13/18 0018 05/13/18 0559 05/13/18 1142 05/14/18 0158  CKTOTAL 240*  --   --   --  TROPONINI 3.10* 2.46* 1.58* 1.56*   BNP: Invalid input(s): POCBNP CBG: Recent Labs  Lab 05/13/18 0931 05/14/18 0803 05/15/18 0828 05/17/18 1131  GLUCAP 105* 111* 97 103*   D-Dimer No results for input(s): DDIMER in the last 72 hours. Hgb A1c No results for input(s): HGBA1C in the last 72 hours. Lipid Profile No results for input(s): CHOL, HDL, LDLCALC, TRIG, CHOLHDL, LDLDIRECT in the last 72 hours. Thyroid function studies No results for input(s): TSH, T4TOTAL, T3FREE, THYROIDAB in the last 72 hours.  Invalid input(s): FREET3 Anemia work up No results for input(s): VITAMINB12, FOLATE, FERRITIN, TIBC, IRON, RETICCTPCT in the last 72 hours. Urinalysis No results found for: COLORURINE, APPEARANCEUR, LABSPEC, PHURINE, GLUCOSEU, HGBUR, BILIRUBINUR, KETONESUR, PROTEINUR, UROBILINOGEN, NITRITE, LEUKOCYTESUR Sepsis Labs Invalid input(s): PROCALCITONIN,  WBC,  LACTICIDVEN Microbiology Recent Results (from the past 240 hour(s))  MRSA PCR  Screening     Status: None   Collection Time: 05/12/18  9:54 PM  Result Value Ref Range Status   MRSA by PCR NEGATIVE NEGATIVE Final    Comment:        The GeneXpert MRSA Assay (FDA approved for NASAL specimens only), is one component of a comprehensive MRSA colonization surveillance program. It is not intended to diagnose MRSA infection nor to guide or monitor treatment for MRSA infections. Performed at [facility], [address] [place], [place] [address]      Patient was seen and examined on the day of discharge and was found to be in stable condition. Time coordinating discharge: 35 minutes including assessment and coordination of care, as well as examination of the patient.   SIGNED:  Noralee Stain, DO [facility] Pager [phone]  If 7PM-7AM, please contact night-coverage [email or web address] Password [facility] 05/17/2018, 4:41 PM

## 2018-05-17 NOTE — Care Management Important Message (Signed)
Important Message  Patient Details  Name: Stephanie Hahn MRN: [id number] Date of Birth: 09/16/1945   Medicare Important Message Given:  Yes    Oralia RudMegan P Isra Lindy 05/17/2018, 2:54 PM

## 2018-05-17 NOTE — Progress Notes (Signed)
Inpatient Rehabilitation-Admissions Coordinator   [facility]. AC has updated pt, her son, RN, and CM regarding plan. Please call if questions.   Nanine MeansKelly Madelena Maturin, OTR/L  Rehab Admissions Coordinator  [phone] 05/17/2018 4:58 PM

## 2018-05-17 NOTE — Progress Notes (Signed)
PMR Admission Coordinator Pre-Admission Assessment  Patient: Stephanie Hahn is an 72 y.o., female MRN: [id number] DOB: 05-06-1946 Height: '5\' 4"'  (162.6 cm) Weight: 93.7 kg                                                                                                                                                  Insurance Information HMO:     PPO:      PCP:      IPA:      80/20:      OTHER:  PRIMARY: Medicare part A only      Policy#: [id number]      Subscriber: Patient CM Name:       Phone#:      Fax#:  Pre-Cert#:       Employer:  Benefits:  Phone #: NA     Name: Verified eligibility online via [employer] on 05/17/18 Eff. Date: Part A effective: 11/07/10     Deduct: $1,364      Out of Pocket Max: NA      Life Max: NA CIR: Covered per Medicare Guidelines once yearly deductible is met.   SNF: Covered per Medicare guidelines; days 1-20, 100%; days 21-100, 80% Outpatient: no benefit with Part A    Co-Pay:  Home Health: 100%      Co-Pay:  DME: no benefit with Part A     Co-Pay:  Providers: SECONDARY: BCBS      Policy#: [id number]      Subscriber: Patient CM Name:       Phone#:      Fax#:  Pre-Cert#:       Employer:  Benefits:  Phone #: [phone]     Name:  Eff. Date:      Deduct:       Out of Pocket Max:       Life Max:  CIR:       SNF:  Outpatient:      Co-Pay:  Home Health:       Co-Pay:  DME:      Co-Pay:   Medicaid Application Date:       Case Manager:  Disability Application Date:      Case Worker:   Emergency Contact Information         Contact Information    Name Relation Home Work Mobile   [place] Daughter [phone]     [place],Vanessa Sister [phone]       Current Medical History  Patient Admitting Diagnosis: Encephalopathy  History of Present Illness: Stephanie Hahn is a 42 year old right-handed female with history of chronic back pain maintained on Neurontin and Percocet, CAD, hypertension and hyperlipidemia. Per chart review and patient,  patient lives alone independent prior to admission. One level home with 2 steps to entry. She does have family in the area that can assist. Presented to [facility]  after being found down poorly responsive and hypoxic. Per EMS report saturations in the 50s with poor respiratory effort. She did respond to Narcan. Patient noted to be in atrial fibrillation with RVR placed on IV heparin. She was transferred to [facility] for further evaluation. UDS positive for opiates and benzos. Chemistry panel creatinine 2.40 from baseline 1.0, CK 240, WBC 17,000, lactic acid reported to be normal, troponin 1.78-3.7. Cranial CT scan reviewed, unremarkable for acute intracranial process. Chest x-ray showed no focal infiltrate. Unilateral pelvic films question very subtle nondisplaced left femoral neck fracture cannot be excluded. excluded no evidence of dislocation area follow-up MRI of bilateral globus pallidus restricted diffusion reflecting possible acute injury from hypoxia, metabolic disturbance. Additional scattered punctate foci of restricted diffusion in both cerebral hemispheres suggesting acute embolic infarctions. Carotid Dopplers with 40-59% left ICA stenosis. EEG indicated of severe encephalopathy. Elevated troponin felt to be related to demand ischemia. Echocardiogram with ejection fraction of 16% grade 2 diastolic dysfunction. Neurology follow-up maintained on Eliquis for both CVA prophylaxis and atrial fibrillation. Tolerating a regular diet. Therapy evaluations completed with recommendations of physical medicine rehabilitation consult. Patient is to be admitted for a comprehensive rehabilitation program on 05/17/18.  Past Medical History  History reviewed. No pertinent past medical history.  Family History  family history includes Alzheimer's disease in her other and other.  Prior Rehab/Hospitalizations:  Has the patient had major surgery during 100 days prior to admission? No  Current  Medications   Current Facility-Administered Medications:  .   stroke: mapping our early stages of recovery book, , Does not apply, Once, Amie Portland, MD .  0.9 %  sodium chloride infusion, 250 mL, Intravenous, PRN, Opyd, Ilene Qua, MD .  acetaminophen (TYLENOL) tablet 650 mg, 650 mg, Oral, Q6H PRN, 650 mg at 05/14/18 1319 **OR** acetaminophen (TYLENOL) suppository 650 mg, 650 mg, Rectal, Q6H PRN, Opyd, Ilene Qua, MD .  apixaban (ELIQUIS) tablet 5 mg, 5 mg, Oral, BID, Alvira Philips, Armstrong, 5 mg at 05/16/18 2157 .  atorvastatin (LIPITOR) tablet 80 mg, 80 mg, Oral, Daily, Dessa Phi, DO, 80 mg at 05/16/18 0940 .  bisacodyl (DULCOLAX) suppository 10 mg, 10 mg, Rectal, Daily PRN, Opyd, Ilene Qua, MD .  ezetimibe (ZETIA) tablet 10 mg, 10 mg, Oral, Daily, Dessa Phi, DO, 10 mg at 05/16/18 0940 .  hydrALAZINE (APRESOLINE) injection 10 mg, 10 mg, Intravenous, Q4H PRN, Dessa Phi, DO, 10 mg at 05/16/18 2354 .  hydrochlorothiazide (MICROZIDE) capsule 12.5 mg, 12.5 mg, Oral, Daily, Dessa Phi, DO, 12.5 mg at 05/16/18 1215 .  MEDLINE mouth rinse, 15 mL, Mouth Rinse, BID, Opyd, Ilene Qua, MD, 15 mL at 05/16/18 2157 .  methimazole (TAPAZOLE) tablet 10 mg, 10 mg, Oral, Daily, Dessa Phi, DO, 10 mg at 05/16/18 0940 .  metoprolol succinate (TOPROL-XL) 24 hr tablet 100 mg, 100 mg, Oral, Daily, Dessa Phi, DO, 100 mg at 05/16/18 0940 .  naloxone (NARCAN) injection 0.4 mg, 0.4 mg, Intravenous, PRN, Opyd, Ilene Qua, MD .  ondansetron (ZOFRAN) tablet 4 mg, 4 mg, Oral, Q6H PRN **OR** ondansetron (ZOFRAN) injection 4 mg, 4 mg, Intravenous, Q6H PRN, Opyd, Timothy S, MD .  pantoprazole (PROTONIX) EC tablet 80 mg, 80 mg, Oral, Daily, Dessa Phi, DO, 80 mg at 05/16/18 0940 .  potassium chloride SA (K-DUR,KLOR-CON) CR tablet 40 mEq, 40 mEq, Oral, Q4H, Choi, Jennifer, DO .  senna-docusate (Senokot-S) tablet 1 tablet, 1 tablet, Oral, QHS PRN, Opyd, Ilene Qua, MD .  sodium chloride flush (  NS)  0.9 % injection 3 mL, 3 mL, Intravenous, Q12H, Opyd, Ilene Qua, MD, 3 mL at 05/16/18 2157 .  sodium chloride flush (NS) 0.9 % injection 3 mL, 3 mL, Intravenous, Q12H, Opyd, Ilene Qua, MD, 3 mL at 05/15/18 1202 .  sodium chloride flush (NS) 0.9 % injection 3 mL, 3 mL, Intravenous, PRN, Opyd, Ilene Qua, MD  Patients Current Diet:     Diet Order                  Diet regular Room service appropriate? Yes; Fluid consistency: Thin  Diet effective now               Precautions / Restrictions Precautions Precautions: Fall Precaution Comments: watch BP Restrictions Weight Bearing Restrictions: No   Has the patient had 2 or more falls or a fall with injury in the past year?No  Prior Activity Level Limited Community (1-2x/wk): retired; did drive PTA: didnt get out of the house much; Independent PTA  Home Assistive Devices / Equipment Home Equipment: None  Prior Device Use: Indicate devices/aids used by the patient prior to current illness, exacerbation or injury? None of the above  Prior Functional Level Prior Function Level of Independence: Independent Comments: ADLs, IADLs, and driving  Self Care: Did the patient need help bathing, dressing, using the toilet or eating?  Independent  Indoor Mobility: Did the patient need assistance with walking from room to room (with or without device)? Independent  Stairs: Did the patient need assistance with internal or external stairs (with or without device)? Unknown, avoided  Functional Cognition: Did the patient need help planning regular tasks such as shopping or remembering to take medications? Independent  Current Functional Level Cognition  Arousal/Alertness: Awake/alert Overall Cognitive Status: Impaired/Different from baseline Current Attention Level: Focused Orientation Level: Oriented to person, Oriented to place, Disoriented to time, Disoriented to situation Following Commands: Follows one step commands  inconsistently Safety/Judgement: Decreased awareness of deficits General Comments: Pt able to state she was at "[place]" but unable to state why. Also reporting her full name, DOB, and current month. Pt with poor problem solving and pcoessing requiring increased cues and time throughout session. During toielting, pt unable to problem solve how to perform toilet hygiene and required increased assistance. Pt with increased impulsivity, requiring redirection to prevent from getting out of bed/chair every 5-10 seconds Attention: Focused Focused Attention: Impaired Focused Attention Impairment: Verbal basic Memory: Impaired Memory Impairment: Storage deficit, Decreased recall of new information Awareness: Impaired Awareness Impairment: Intellectual impairment Problem Solving: Impaired Problem Solving Impairment: Verbal basic Behaviors: Restless Safety/Judgment: Impaired    Extremity Assessment (includes Sensation/Coordination)  Upper Extremity Assessment: Generalized weakness, RUE deficits/detail RUE Deficits / Details: Noting slight decreased strength at right UE   Lower Extremity Assessment: Defer to PT evaluation LLE Deficits / Details: 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 Pt requiring Mod A for sit<>stand and safety Toilet Transfer: Moderate assistance, +2 for physical assistance, Stand-pivot, [facility] Toilet Transfer Details (indicate cue type and  reason): Mod A+2 for sit<>Stand then pivot to [facility] Toileting- Clothing Manipulation and Hygiene: Maximal assistance, Sit to/from stand Toileting - Clothing Manipulation Details (indicate cue type and reason): Pt with difficulty sequencing  Functional mobility during ADLs: Moderate assistance, +2 for physical assistance General ADL Comments: Pt with decreased cognition, balance, and activity tolerance    Mobility  Overal bed mobility: Needs Assistance Bed Mobility: Sit to Supine, Supine to Sit Supine to sit: Min guard, HOB elevated Sit to supine: Min assist General bed mobility comments: Min A for managing BLEs. Poor attention    Transfers  Overall transfer level: Needs assistance Equipment used: 2 person hand held assist Transfers: Sit to/from Stand, Stand Pivot Transfers Sit to Stand: +2 physical assistance, Mod assist Stand pivot transfers: Mod assist, +2 physical assistance Squat pivot transfers: Total assist, +2 physical assistance General transfer comment: Mod A +2 for power up into standing and then Mod A +2 for pivot to [facility]. Pt with poor awareness and requring increased cues for hand placement    Ambulation / Gait / Stairs / Wheelchair Mobility       Posture / Balance Balance Overall balance assessment: Needs assistance Sitting-balance support: Bilateral upper extremity supported, Feet supported Sitting balance-Leahy Scale: Fair Standing balance support: No upper extremity supported, During functional activity Standing balance-Leahy Scale: Poor Standing balance comment: Requiring physical A    Special needs/care consideration BiPAP/CPAP: no CPM: no Continuous Drip IV: 0.9% sodium chloride infusion Dialysis: no        Days: NA Life Vest: no Oxygen: no Special Bed: no Trach Size: no Wound Vac (area): no      Location: NA Skin: no areas of concern                      Location: NA Bowel mgmt:continent, last BM 05/16/18. Bladder mgmt:continent Diabetic mgmt:  no     Previous Home Environment Living Arrangements: Other relatives(Brother)  Lives With: Other (Comment)(brother) Available Help at Discharge: Family, Available PRN/intermittently Type of Home: House Home Layout: One level Home Access: Stairs to enter Entrance Stairs-Rails: None Entrance Stairs-Number of Steps: 2 Bathroom Toilet: [place]: No  Discharge Living Setting Plans for Discharge Living Setting: Patient's home(brother stays with her most days) Type of Home at Discharge: House Discharge Home Layout: One level Discharge Home Access: Stairs to enter Entrance Stairs-Rails: None Entrance Stairs-Number of Steps: 1 Discharge Bathroom Shower/Tub: Tub/shower unit Discharge Bathroom Toilet: Standard Discharge Bathroom Accessibility: Yes How Accessible: Accessible via walker Does the patient have any problems obtaining your medications?: No  Social/Family/Support Systems 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, retired; ) Sport and exercise psychologist Information: son Cherre Huger): [phone]; Elwin Sleight (daughter): [phone];  Anticipated Caregiver: 3 children; Nicole Kindred, Tamika, Crystal); all three plan to split shifts Anticipated Caregiver's Contact Information: see above Ability/Limitations of Caregiver: Her grown children can provide Min/Mod A; brother can do supervision Caregiver Availability: 24/7 Discharge Plan Discussed with Primary Caregiver: Yes(with tony and patient) Is Caregiver In Agreement with Plan?: Yes Does Caregiver/Family have Issues with Lodging/Transportation while Pt is in Rehab?: No   Goals/Additional Needs Patient/Family Goal for Rehab: PT/OT: supervision/Min A; SLP: min A Expected length of stay: 16-20 days Cultural Considerations: NA Dietary Needs: regular diet, thin liquids Equipment Needs: TBD Pt/Family Agrees to Admission and willing to participate: Yes Program Orientation Provided & Reviewed with Pt/Caregiver Including Roles  &  Responsibilities: Yes(with pt and her son, Nicole Kindred)  Barriers to Discharge: Home environment access/layout  Barriers to Discharge Comments: step  to enter; small bathroom space   Decrease burden of Care through IP rehab admission: NA   Possible need for SNF placement upon discharge: Not anticipated; pt has strong social support at DC through her 3 children and has good prognosis for further progress with CIR.    Patient Condition: This patient's medical and functional status has changed since the consult dated 05/16/18 in which the Rehabilitation Physician determined and documented that the patient was potentially appropriate for intensive rehabilitative care in an inpatient rehabilitation facility. Issues have been addressed and update has been discussed with Dr. Naaman Plummer and patient now appropriate for inpatient rehabilitation. Pt has 24/7 caregiver assist in place at DC between the pt's three children and brother and pt is now agreeable to participating in [facility] program. Will admit to inpatient rehab today.   Preadmission Screen Completed By:  Jhonnie Garner, 05/17/2018 8:20 AM ______________________________________________________________________   Discussed status with Dr. Naaman Plummer on 05/17/18 at 4:28PM and received telephone approval for admission today.  Admission Coordinator:  Jhonnie Garner, time 4:28PM Sudie Grumbling 05/17/18.            Cosigned by: Meredith Staggers, MD at 05/17/2018 4:53 PM  Revision History

## 2018-05-17 NOTE — Progress Notes (Signed)
Pt A/O, Denies SI; pt contracted for safety. 6/10 pain, "right side." Upper dentures. Pt resides with brother. Tele-sitter administered d/t impulsiveness. Cont B/B, LBM 05/17/18. Ambulate with stand by assist. No noted skin issues. Educated pt on use of call light. Son was informed to bring pt clothing. Staff will continue to monitor and meet needs.

## 2018-05-18 ENCOUNTER — Inpatient Hospital Stay (HOSPITAL_COMMUNITY): Payer: Federal, State, Local not specified - PPO | Admitting: Physical Therapy

## 2018-05-18 ENCOUNTER — Inpatient Hospital Stay (HOSPITAL_COMMUNITY): Payer: Federal, State, Local not specified - PPO | Admitting: Occupational Therapy

## 2018-05-18 ENCOUNTER — Inpatient Hospital Stay (HOSPITAL_COMMUNITY): Payer: Federal, State, Local not specified - PPO | Admitting: Speech Pathology

## 2018-05-18 DIAGNOSIS — I6930 Unspecified sequelae of cerebral infarction: Secondary | ICD-10-CM

## 2018-05-18 LAB — COMPREHENSIVE METABOLIC PANEL
ALBUMIN: 3.1 g/dL — AB (ref 3.5–5.0)
ALT: 15 U/L (ref 0–44)
AST: 13 U/L — ABNORMAL LOW (ref 15–41)
Alkaline Phosphatase: 64 U/L (ref 38–126)
Anion gap: 11 (ref 5–15)
BUN: 11 mg/dL (ref 8–23)
CHLORIDE: 101 mmol/L (ref 98–111)
CO2: 25 mmol/L (ref 22–32)
Calcium: 9.2 mg/dL (ref 8.9–10.3)
Creatinine, Ser: 1.24 mg/dL — ABNORMAL HIGH (ref 0.44–1.00)
GFR calc Af Amer: 50 mL/min — ABNORMAL LOW (ref >60)
GFR calc non Af Amer: 43 mL/min — ABNORMAL LOW (ref >60)
Glucose, Bld: 106 mg/dL — ABNORMAL HIGH (ref 70–99)
Potassium: 3.5 mmol/L (ref 3.5–5.1)
Sodium: 137 mmol/L (ref 135–145)
Total Bilirubin: 1.4 mg/dL — ABNORMAL HIGH (ref 0.3–1.2)
Total Protein: 6.3 g/dL — ABNORMAL LOW (ref 6.5–8.1)

## 2018-05-18 LAB — CBC WITH DIFFERENTIAL/PLATELET
Abs Immature Granulocytes: 0.11 10*3/uL — ABNORMAL HIGH (ref 0.00–0.07)
Basophils Absolute: 0 10*3/uL (ref 0.0–0.1)
Basophils Relative: 0 %
Eosinophils Absolute: 0.1 10*3/uL (ref 0.0–0.5)
Eosinophils Relative: 2 %
HCT: 40.2 % (ref 36.0–46.0)
Hemoglobin: 12.1 g/dL (ref 12.0–15.0)
Immature Granulocytes: 1 %
Lymphocytes Relative: 26 %
Lymphs Abs: 2.2 10*3/uL (ref 0.7–4.0)
MCH: 27.5 pg (ref 26.0–34.0)
MCHC: 30.1 g/dL (ref 30.0–36.0)
MCV: 91.4 fL (ref 80.0–100.0)
Monocytes Absolute: 0.6 10*3/uL (ref 0.1–1.0)
Monocytes Relative: 7 %
Neutro Abs: 5.5 10*3/uL (ref 1.7–7.7)
Neutrophils Relative %: 64 %
Platelets: 194 10*3/uL (ref 150–400)
RBC: 4.4 MIL/uL (ref 3.87–5.11)
RDW: 12.2 % (ref 11.5–15.5)
WBC: 8.7 10*3/uL (ref 4.0–10.5)
nRBC: 0.3 % — ABNORMAL HIGH (ref 0.0–0.2)

## 2018-05-18 MED ORDER — POTASSIUM CHLORIDE CRYS ER 10 MEQ PO TBCR
10.0000 meq | EXTENDED_RELEASE_TABLET | Freq: Every day | ORAL | Status: DC
  Administered 2018-05-18 – 2018-05-24 (×7): 10 meq via ORAL
  Filled 2018-05-18 (×7): qty 1

## 2018-05-18 MED ORDER — TRAMADOL HCL 50 MG PO TABS
50.0000 mg | ORAL_TABLET | Freq: Two times a day (BID) | ORAL | Status: DC | PRN
  Administered 2018-05-18 (×2): 50 mg via ORAL
  Filled 2018-05-18 (×2): qty 1

## 2018-05-18 NOTE — Patient Care Conference (Signed)
Inpatient RehabilitationTeam Conference and Plan of Care Update Date: 05/19/2018   Time: 10:00 AM    Patient Name: Stephanie Hahn      Medical Record Number: [id number]  Date of Birth: [birth year] Sex: Female         Room/Bed: 4M01C/4M01C-01 Payor Info: Payor: MEDICARE / Plan: MEDICARE PART A / Product Type: *No Product type* /    Admitting Diagnosis: encephekcpathy  Admit Date/Time:  05/17/2018  5:41 PM Admission Comments: No comment available   Primary Diagnosis:  <principal problem not specified> Principal Problem: <principal problem not specified>  Patient Active Problem List   Diagnosis Date Noted  . Anoxic encephalopathy (HCC)   . Atrial fibrillation with rapid ventricular response (HCC)   . Hypertensive crisis   . AKI (acute kidney injury) (HCC)   . Hypokalemia   . 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 Left leg pain 05/13/2018  . Essential hypertension 05/13/2018  . CAD (coronary artery disease) 05/13/2018  . Depression 05/13/2018  . Overdose opiate, accidental or unintentional, subsequent encounter 05/12/2018  . NSTEMI (non-ST elevated myocardial infarction) (HCC) 05/12/2018  . Aspiration pneumonia (HCC) 05/12/2018  . Chronic pain 05/12/2018  . Acute encephalopathy 05/12/2018  . Seizure-like activity (HCC) 05/12/2018  . Opiate overdose (HCC) 05/12/2018    Expected Discharge Date: Expected Discharge Date: to be discussed next week - about a week  Team Members Present: Physician leading conference: Dr. Claudette Laws Social Worker Present: Staci Acosta, LCSW Nurse Present: Ronny Bacon, RN PT Present: Maren Reamer, PT OT Present: Amy Rounds, OT PPS Coordinator present : Tora Duck, RN, CRRN;Melissa Bowie     Current Status/Progress Goal Weekly Team Focus  Medical   History of overdose, Percocet, wants to go home, lives with brother, no new medical issues except for hypertension  Reduce fall risk, manage pain without schedule to  narcotics  Evaluate medication regimen   Bowel/Bladder   Continent of bowel/bladder; LBM 05/17/18  Remain continent of bowel/bladder with min assist  Assess B/B function q shift and as needed   Swallow/Nutrition/ Hydration   pending eval         ADL's   pending eval         Mobility   pending eval  pending eval  pending eval   Communication   pending eval         Safety/Cognition/ Behavioral Observations  pending eval         Pain   Complained of generalized pain, Tylenol PRN given and was effefctive  <2  Assess and treat pain q shift and as needed   Skin   No acute skin issue noted  Maintain skin integrity with mni assist  Assess skin q shift and as needed    Rehab Goals Patient on target to meet rehab goals: Yes Rehab Goals Revised: none - Pt's first conference on day of evaluations *See Care Plan and progress notes for long and short-term goals.     Barriers to Discharge  Current Status/Progress Possible Resolutions Date Resolved   Physician    Medical stability     Progressing  See above, caregiver education      Nursing                  PT  Inaccessible home environment;Decreased caregiver support;Home environment access/layout;Lack of/limited family support                 OT Other (comments)  none known at this time  SLP                SW                Discharge Planning/Teaching Needs:  CSW to meet with pt and family to learn of d/c plan.  Family education closer to d/c.   Team Discussion:  Pt with encephalopathy due to anoxia with accidental overdose vs. Effects of cerebral infarcts.  Pt has cognitive, balance deficits, and poor awareness.  Revisions to Treatment Plan:  None - pt's first conference on day of evaluations    Continued Need for Acute Rehabilitation Level of Care: The patient requires daily medical management by a physician with specialized training in physical medicine and rehabilitation for the following conditions: Daily  direction of a multidisciplinary physical rehabilitation program to ensure safe treatment while eliciting the highest outcome that is of practical value to the patient.: Yes Daily medical management of patient stability for increased activity during participation in an intensive rehabilitation regime.: Yes Daily analysis of laboratory values and/or radiology reports with any subsequent need for medication adjustment of medical intervention for : Neurological problems;Blood pressure problems;Other   I attest that I was present, lead the team conference, and concur with the assessment and plan of the team.   Kamree Wiens, Vista DeckJennifer Capps 05/19/2018, 9:37 AM

## 2018-05-18 NOTE — Progress Notes (Signed)
Inpatient Rehabilitation Center Individual Statement of Services  Patient Name:  Stephanie Hahn  Date:  05/18/2018  Welcome to the Inpatient Rehabilitation Center.  Our goal is to provide you with an individualized program based on your diagnosis and situation, designed to meet your specific needs.  With this comprehensive rehabilitation program, you will be expected to participate in at least 3 hours of rehabilitation therapies Monday-Friday, with modified therapy programming on the weekends.  Your rehabilitation program will include the following services:  Physical Therapy (PT), Occupational Therapy (OT), Speech Therapy (ST), 24 hour per day rehabilitation nursing, Case Management (Social Worker), Rehabilitation Medicine, Nutrition Services and Pharmacy Services  Weekly team conferences will be held on Wednesdays to discuss your progress.  Your Social Worker will talk with you frequently to get your input and to update you on team discussions.  Team conferences with you and your family in attendance may also be held.  Expected length of stay:  5 to 7 days                      Overall anticipated outcome:  Supervision with minimal assistance needed for cognitive tasks  Depending on your progress and recovery, your program may change. Your Social Worker will coordinate services and will keep you informed of any changes. Your Social Worker's name and contact numbers are listed  below.  The following services may also be recommended but are not provided by the Inpatient Rehabilitation Center:   Driving Evaluations  Home Health Rehabiltiation Services  Outpatient Rehabilitation Services   Arrangements will be made to provide these services after discharge if needed.  Arrangements include referral to agencies that provide these services.  Your insurance has been verified to be:  Medicare A and Federal [employer] Your primary doctor is:  Dr. Barron Alvineavid Gibson  Pertinent information will  be shared with your doctor and your insurance company.  Social Worker:  Staci AcostaJenny Mikolaj Woolstenhulme, LCSW  [phone] or ([phone]) [phone]  Information discussed with and copy given to patient by: [name], [name], 05/18/2018, 11:55 PM

## 2018-05-18 NOTE — Progress Notes (Signed)
Inpatient Rehabilitation  Patient information reviewed and entered into eRehab system by Aaronmichael Brumbaugh M. Sandro Burgo, M.A., CCC/SLP, PPS Coordinator.  Information including medical coding, functional ability and quality indicators will be reviewed and updated through discharge.    Per nursing patient was given "Data Collection Information Summary" for Patients in Inpatient Rehabilitation Facilities with attached "Privacy Act Statement-Health Care Records" upon admission.   

## 2018-05-18 NOTE — Progress Notes (Signed)
[place] PHYSICAL MEDICINE & REHABILITATION PROGRESS NOTE   Subjective/Complaints: Pt c/o RIght sided flank pain, no pain with breathing, no pain during meals  Pt points to R CVA , denies pain with urination  ROS- neg CP, SOB, N/V/D   Objective:   No results found. Recent Labs    05/16/18 0150  WBC 7.5  HGB 12.8  HCT 40.8  PLT 163   Recent Labs    05/16/18 0150 05/17/18 0214  NA 139 140  K 2.9* 3.1*  CL 99 98  CO2 30 27  GLUCOSE 118* 116*  BUN 12 10  CREATININE 1.09* 1.13*  CALCIUM 8.8* 8.8*    Intake/Output Summary (Last 24 hours) at 05/18/2018 0704 Last data filed at 05/17/2018 2103 Gross per 24 hour  Intake 60 ml  Output -  Net 60 ml     Physical Exam: Vital Signs Blood pressure (!) 154/75, pulse 85, temperature 98.8 F (37.1 C), temperature source Oral, resp. rate 16, height 5\' 4"  (1.626 m), weight 90.9 kg, SpO2 100 %.  General: No acute distress Mood and affect are appropriate Heart: Regular rate and rhythm no rubs murmurs or extra sounds Lungs: Clear to auscultation, breathing unlabored, no rales or wheezes Abdomen: Positive bowel sounds, soft nontender to palpation, nondistended Extremities: No clubbing, cyanosis, or edema Skin: No evidence of breakdown, no evidence of rash, no ecchymosis over flank,  Neurologic: Cranial nerves II through XII intact, motor strength is 5/5 in bilateral deltoid, bicep, tricep, grip, hip flexor, knee extensors, ankle dorsiflexor and plantar flexor  Musculoskeletal: Full range of motion in all 4 extremities. No joint swelling Back no tenderness to palpation, no pain with sup to sit   Assessment/Plan: 1. 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  Physiatrist is providing close team supervision and 24 hour management of active medical problems listed below.  Physiatrist and rehab  team continue to assess barriers to discharge/monitor patient progress toward functional and medical goals  Care Tool:  Bathing              Bathing assist       Upper Body Dressing/Undressing Upper body dressing        Upper body assist      Lower Body Dressing/Undressing Lower body dressing            Lower body assist       Toileting Toileting    Toileting assist Assist for toileting: Contact Guard/Touching assist     Transfers Chair/bed transfer  Transfers assist           Locomotion Ambulation   Ambulation assist      Assist level: Contact Guard/Touching assist Assistive device: Hand held assist     Walk 10 feet activity   Assist           Walk 50 feet activity   Assist           Walk 150 feet activity   Assist           Walk 10 feet on uneven surface  activity   Assist           Wheelchair     Assist               Wheelchair 50 feet with 2 turns activity    Assist            Wheelchair  150 feet activity     Assist          Medical Problem List and Plan: 1.Decreased functional mobilitysecondary to bilateral cardioembolic infarcts  as well as  Hypoxic/ichemic encephalopathy due to potentialunintentional drug overdose CIR evals today 2. DVT Prophylaxis/Anticoagulation: Eliquis 3. Pain Management/chronic back pain:Tylenol as needed. Patient had been maintained on Neurontin and Percocet prior to admission for chronic back pain discontinued due to encephalopathy. Patient did receive Narcan.Pt asking for pain meds, fo rback pain exam is normal will avoid schedule 2  Narcotics 4. Mood:Provide emotional supportas needed 5. Neuropsych: This patientisnotcapable of making decisions onherown behalf. 6. Skin/Wound Care:Routine skin checks 7. Fluids/Electrolytes/Nutrition:Routine ins and outs with follow-up chemistries. Encourage po 8. A. Fib with RVR. Cardiac rate  controlled. Elevated troponin felt to be related to demand ischemia. Continue Eliquis 9. Hypertension. HCTZ 12.5 mg daily, Toprol 100 mg daily. Monitor with increased mobility 10. Hyperthyroidism. Continue Tapazole 11. Acute kidney injury. Follow-up chemistries. Baseline creatinine 1.0 12. Acute left leg pain. X-rays and imaging revealed possible subtle nondisplaced left femoral neck fracture cannot be excluded, no complaints of LLE this am, no pain with AROM LLE. May need to pursue CT scan if any further complaints 13. Hyperlipidemia. Lipitor/Zetia    LOS: 1 days A FACE TO FACE EVALUATION WAS PERFORMED  Erick Colace 05/18/2018, 7:04 AM

## 2018-05-18 NOTE — Evaluation (Signed)
Speech Language Pathology Assessment and Plan  Patient Details  Name: Stephanie Hahn MRN: [id number] Date of Birth: 01-03-1946  SLP Diagnosis: Cognitive Impairments  Rehab Potential: Good ELOS: 5-7 days     Today's Date: 05/18/2018 SLP Individual Time: 1400-1500 SLP Individual Time Calculation (min): 60 min   Problem List:  Patient Active Problem List   Diagnosis Date Noted  . 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 Atrial fibrillation with rapid ventricular response ([place])   . Hypertensive crisis   . AKI (acute kidney injury) ([place])   . Hypokalemia   . 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 Left leg pain 05/13/2018  . Essential hypertension 05/13/2018  . CAD (coronary artery disease) 05/13/2018  . Depression 05/13/2018  . Overdose opiate, accidental or unintentional, subsequent encounter 05/12/2018  . NSTEMI (non-ST elevated myocardial infarction) ([place]) 05/12/2018  . Aspiration pneumonia ([place]) 05/12/2018  . Chronic pain 05/12/2018  . Acute encephalopathy 05/12/2018  . Seizure-like activity ([place]) 05/12/2018  . Opiate overdose ([place]) 05/12/2018   Past Medical History: History reviewed. No pertinent past medical history. Past Surgical History: No past surgical history on file.  Assessment / Plan / Recommendation Clinical Impression   Stephanie Hahn is a 81 year old right-handed female with history of chronic back pain maintained on Neurontin and Percocet, CAD, hypertension and hyperlipidemia. Per chart review and patient, patient lives alone independent prior to admission. One level home with 2 steps to entry. She does have family in the area that can assist. Presented to [facility] after being found down poorly responsive and hypoxic. Per EMS report saturations in the 50s with poor respiratory effort. She did respond to Narcan. Patient noted to be in atrial fibrillation with RVR placed on IV heparin. She was transferred to [facility] for [facility] evaluation.  UDS positive for opiates and benzos. Chemistry panel creatinine 2.40 from baseline 1.0, CK 240, WBC 17,000, lactic acid reported to be normal, troponin 1.78-3.7. Cranial CT scan reviewed, unremarkable for acute intracranial process. Chest x-ray showed no focal infiltrate. Unilateral pelvic films question very subtle nondisplaced left femoral neck fracture cannot be excluded. excluded no evidence of dislocation area follow-up MRI of bilateral globus pallidus restricted diffusion reflecting possible acute injury from hypoxia, metabolic disturbance. Additional scattered punctate foci of restricted diffusion in both cerebral hemispheres suggesting acute embolic infarctions. Carotid Dopplers with 40-59% left ICA stenosis. EEG indicated of severe encephalopathy. Elevated troponin felt to be related to demand ischemia. Echocardiogram with ejection fraction of 40% grade 2 diastolic dysfunction. Neurology follow-up maintained on Eliquis for both CVA prophylaxis and atrial fibrillation. Tolerating a regular diet. Therapy evaluations completed with recommendations of physical medicine rehabilitation consult. Patient was admitted for a comprehensive rehabilitation program on 05/17/2018.  SLP evaluation was completed on 05/18/2018 with the following results:  During bedside swallow evaluation, pt presents with grossly intact oropharyngeal swallowing function.  Pt had slightly prolonged but efficient mastication of solids due to missing dentition.  No overt s/s of aspiration were evident with solids or liquids.  Do not recommend additional ST follow up for dysphagia at this time.   Pt also presents with moderate cognitive deficits characterized by decreased selective attention to tasks, decreased recall of information, decreased functional problem solving, and decreased intellectual awareness of her deficits.  Currently pt requires min assist to complete basic tasks and she scored a 18/30 on the MoCA-Basic.  As a result, pt  would benefit from skilled ST while inpatient in order to maximize functional independence and reduce burden  of care prior to discharge.  Anticipate that pt will need 24/7 supervision at discharge in addition to [place] follow up at next level of care.     Skilled Therapeutic Interventions          Cognitive-linguistic evaluation completed with results and recommendations reviewed with patient.     SLP Assessment  Patient will need skilled Speech Lanaguage Pathology Services during CIR admission    Recommendations  SLP Diet Recommendations: Age appropriate regular solids;Thin Liquid Administration via: Cup;Straw Medication Administration: Whole meds with liquid Supervision: Patient able to self feed;Intermittent supervision to cue for compensatory strategies Compensations: Minimize environmental distractions;Slow rate;Small sips/bites Postural Changes and/or Swallow Maneuvers: Upright 30-60 min after meal;Seated upright 90 degrees Oral Care Recommendations: Oral care BID Recommendations for Other Services: Neuropsych consult Patient destination: Home Follow up Recommendations: Home Health SLP;24 hour supervision/assistance Equipment Recommended: None recommended by SLP    SLP Frequency 3 to 5 out of 7 days   SLP Duration  SLP Intensity  SLP Treatment/Interventions 5-7 days   Minumum of 1-2 x/day, 30 to 90 minutes  Cognitive remediation/compensation;Cueing hierarchy;Environmental controls;Internal/external aids;Patient/family education    Pain Pain Assessment Pain Scale: 0-10 Pain Score: 0-No pain Pain Type: Acute pain Pain Location: Flank Pain Orientation: Right Pain Descriptors / Indicators: Aching Pain Onset: On-going Pain Intervention(s): RN made aware;Repositioned Multiple Pain Sites: No  Prior Functioning Cognitive/Linguistic Baseline: Within functional limits Type of Home: House  Lives With: Family Available Help at Discharge: Family;Available 24 hours/day Education:  GED Vocation: Retired  Industrial/product designer Term Goals: Week 1: SLP Short Term Goal 1 (Week 1): STG=LTG due to ELOS  Refer to Care Plan for Long Term Goals  Recommendations for other services: Neuropsych  Discharge Criteria: Patient will be discharged from SLP if patient refuses treatment 3 consecutive times without medical reason, if treatment goals not met, if there is a change in medical status, if patient makes no progress towards goals or if patient is discharged from hospital.  The above assessment, treatment plan, treatment alternatives and goals were discussed and mutually agreed upon: by patient  Emilio Math 05/18/2018, 3:58 PM

## 2018-05-18 NOTE — Evaluation (Signed)
Physical Therapy Assessment and Plan  Patient Details  Name: Stephanie Hahn MRN: [id number] Date of Birth: 15-Mar-1946  PT Diagnosis: Abnormal posture, Abnormality of gait, Impaired cognition and Muscle weakness Rehab Potential: Good ELOS: 5-7 days   Today's Date: 05/18/2018 PT Individual Time: 4259-5638 PT Individual Time Calculation (min): 60 min    Problem List:  Patient Active Problem List   Diagnosis Date Noted  . 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 Atrial fibrillation with rapid ventricular response ([place])   . Hypertensive crisis   . AKI (acute kidney injury) ([place])   . Hypokalemia   . 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 Left leg pain 05/13/2018  . Essential hypertension 05/13/2018  . CAD (coronary artery disease) 05/13/2018  . Depression 05/13/2018  . Overdose opiate, accidental or unintentional, subsequent encounter 05/12/2018  . NSTEMI (non-ST elevated myocardial infarction) ([place]) 05/12/2018  . Aspiration pneumonia ([place]) 05/12/2018  . Chronic pain 05/12/2018  . Acute encephalopathy 05/12/2018  . Seizure-like activity ([place]) 05/12/2018  . Opiate overdose ([place]) 05/12/2018    Past Medical History: History reviewed. No pertinent past medical history. Past Surgical History: No past surgical history on file.  Assessment & Plan Clinical Impression: VFI:EPPIR Debruyne is a 72 year old right-handed female with history of chronic back pain maintained on Neurontin and Percocet, CAD, hypertension and hyperlipidemia. Per chart review and patient, patient lives alone independent prior to admission. One level home with 2 steps to entry. She does have family in the area that can assist. Presented to [facility] after being found down poorly responsive and hypoxic. Per EMS report saturations in the 50s with poor respiratory effort. She did respond to Narcan. Patient noted to be in atrial fibrillation with RVR placed on IV heparin. She was transferred to [facility] for further evaluation. UDS positive for opiates and benzos. Chemistry panel creatinine 2.40 from baseline 1.0, CK 240, WBC 17,000, lactic acid reported to be normal, troponin 1.78-3.7. Cranial CT scan reviewed, unremarkable for acute intracranial process. Chest x-ray showed no focal infiltrate. Unilateral pelvic films question very subtle nondisplaced left femoral neck fracture cannot be excluded. excluded no evidence of dislocation area follow-up MRI of bilateral globus pallidus restricted diffusion reflecting possible acute injury from hypoxia, metabolic disturbance. Additional scattered punctate foci of restricted diffusion in both cerebral hemispheres suggesting acute embolic infarctions. Carotid Dopplers with 40-59% left ICA stenosis. EEG indicated of severe encephalopathy. Elevated troponin felt to be related to demand ischemia. Echocardiogram with ejection fraction of 51% grade 2 diastolic dysfunction. Neurology follow-up maintained on Eliquis for both CVA prophylaxis and atrial fibrillation. Tolerating a regular diet. Therapy evaluations completed with recommendations of physical medicine rehabilitation consult. Patient was admitted for a comprehensive rehabilitation program.Patient transferred to CIR on 05/17/2018 .   Patient currently requires supervision with mobility secondary to muscle weakness, decreased motor planning and decreased standing balance, decreased postural control and decreased balance strategies.  Prior to hospitalization, patient was independent  with mobility and lived with Family in a one level home.  Home access is 2 stairs to enter.  Patient will benefit from skilled PT intervention to maximize safe functional mobility, minimize fall risk and decrease caregiver burden for planned discharge home with 24 hour assist.  Anticipate patient will benefit from follow up [facility] at discharge.  PT - End of Session Activity Tolerance: Tolerates 30+ min activity with multiple  rests Endurance Deficit: Yes Endurance Deficit Description: frequent rest breaks required PT Assessment Rehab Potential (ACUTE/IP ONLY): Good PT Barriers to  Discharge: Inaccessible home environment;Decreased caregiver support;Home environment access/layout;Lack of/limited family support PT Patient demonstrates impairments in the following area(s): Balance;Behavior;Endurance;Motor;Pain;Safety;Sensory PT Transfers Functional Problem(s): Bed Mobility;Bed to Chair;Car PT Locomotion Functional Problem(s): Ambulation;Wheelchair Mobility;Stairs PT Plan PT Intensity: Minimum of 1-2 x/day ,45 to 90 minutes PT Frequency: 5 out of 7 days PT Duration Estimated Length of Stay: 5-7 days PT Treatment/Interventions: Ambulation/gait training;Community reintegration;DME/adaptive equipment instruction;Neuromuscular re-education;Psychosocial support;Stair training;UE/LE Strength taining/ROM;Wheelchair propulsion/positioning;Balance/vestibular training;Discharge planning;Pain management;Therapeutic Activities;UE/LE Coordination activities;Skin care/wound management;Cognitive remediation/compensation;Disease management/prevention;Functional mobility training;Patient/family education;Splinting/orthotics;Therapeutic Exercise;Visual/perceptual remediation/compensation PT Transfers Anticipated Outcome(s): modified independent with LRAD PT Locomotion Anticipated Outcome(s): supervision up to 150' with LRAD PT Recommendation Recommendations for Other Services: Therapeutic Recreation consult Therapeutic Recreation Interventions: Pet therapy;Kitchen group;Stress management;Outing/community reintergration Follow Up Recommendations: Home health PT Patient destination: Home Equipment Recommended: To be determined  Skilled Therapeutic Intervention Patient educated on rehab process, plan of care, and safety precautions with pt verbalized understanding/agreement. Completed evaluation and initiated treatment; see below for more  details.  Car transfer completed with RW. CGA from SPT. Verbal cues for LE sequencing when getting into and out of car.   Gait training over blue mat x2 with RW, CGA from SPT. Verbal cues to slow down at edge of mat and for safety awareness. Pt mildly impulsive and required cuing to step onto/off the mat. Gait training up/down ramp with RW, CGA from SPT for pt safety.  Completed Berg Balance Test. Pt received score of 34/56. A score of less than 45 is indicative of a high risk for falls.   Ended session with pt supine in bed, call bell within reach, and all needs met. Notified RN of pt complaint of pain in R rib.   PT Evaluation Precautions/Restrictions Precautions Precautions: Fall Restrictions Weight Bearing Restrictions: No General   Vital SignsTherapy Vitals Temp: 98.8 F (37.1 C) Temp Source: Oral Pulse Rate: 76 Resp: 18 BP: 125/81 Patient Position (if appropriate): Sitting Oxygen Therapy SpO2: 100 % O2 Device: Room Air Pain Pain Assessment Pain Scale: 0-10 Pain Score: 0-No pain Pain Type: Acute pain Pain Location: Flank Pain Orientation: Right Pain Descriptors / Indicators: Aching Pain Onset: On-going Pain Intervention(s): RN made aware Multiple Pain Sites: No Home Living/Prior Functioning Home Living Available Help at Discharge: Family;Available 24 hours/day Type of Home: House Home Access: Stairs to enter [employer] of Steps: 0 Entrance Stairs-Rails: None Home Layout: One level Bathroom Shower/Tub: Chiropodist: Standard Bathroom Accessibility: Yes  Lives With: Family Prior Function Level of Independence: Independent with basic ADLs;Independent with homemaking with ambulation;Independent with gait;Independent with transfers Driving: Yes Vocation: Retired Comments: ADLs, IADLs, and driving Vision/Perception  Geologist, engineering: Within Functional Limits Praxis Praxis: Impaired Praxis-Other Comments: impulsive   Cognition Overall Cognitive Status: Impaired/Different from baseline Arousal/Alertness: Awake/alert Orientation Level: Oriented X4 Attention: Selective Selective Attention: Impaired Selective Attention Impairment: Functional basic;Verbal basic Memory: Impaired Memory Impairment: Retrieval deficit Awareness: Impaired Awareness Impairment: Intellectual impairment Problem Solving: Impaired Problem Solving Impairment: Functional basic Executive Function: Organizing;Self Monitoring;Self Correcting Organizing: Impaired Organizing Impairment: Functional basic Self Monitoring: Impaired Self Monitoring Impairment: Functional basic Self Correcting: Impaired Self Correcting Impairment: Functional basic Behaviors: Impulsive Safety/Judgment: Impaired Comments: impulsive Sensation Sensation Light Touch: Appears Intact Hot/Cold: Appears Intact Proprioception: Appears Intact Stereognosis: Not tested Additional Comments: BLE sensation intact Coordination Gross Motor Movements are Fluid and Coordinated: Yes Fine Motor Movements are Fluid and Coordinated: Yes Coordination and Movement Description: decreased dexterity - artifical nails increasing difficulty as well Motor  Motor Motor: Within Functional Limits Motor - Skilled Clinical  Observations: generalized weakness  Mobility Bed Mobility Bed Mobility: Supine to Sit;Sit to Supine Supine to Sit: Supervision/Verbal cueing Sit to Supine: Supervision/Verbal cueing Transfers Transfers: Sit to Stand;Stand Pivot Transfers Sit to Stand: Contact Guard/Touching assist Stand to Sit: Contact Guard/Touching assist Stand Pivot Transfers: Contact Guard/Touching assist Transfer (Assistive device): Rolling walker Locomotion  Gait Ambulation: Yes Gait Assistance: Contact Guard/Touching assist Gait Distance (Feet): 150 Feet Assistive device: Rolling walker Gait Gait: Yes Gait Pattern: Impaired Gait Pattern: Decreased stance time -  left;Decreased hip/knee flexion - right;Decreased step length - right;Poor foot clearance - right;Decreased trunk rotation Gait velocity: diminished  Stairs / Additional Locomotion Stairs: Yes Stairs Assistance: Minimal Assistance - Patient > 75% Stair Management Technique: Two rails Number of Stairs: 4 Height of Stairs: 6 Ramp: Nurse, mental health Mobility: Yes Wheelchair Assistance: Development worker, international aid: Both upper extremities Wheelchair Parts Management: Needs assistance Distance: 100  Trunk/Postural Assessment  Cervical Assessment Cervical Assessment: Exceptions to WFL(forward head) Thoracic Assessment Thoracic Assessment: Exceptions to WFL(kyphotic) Lumbar Assessment Lumbar Assessment: Exceptions to WFL(posterior pelvic tilt) Postural Control Postural Control: Deficits on evaluation(delayed)  Balance Balance Balance Assessed: Yes Standardized Balance Assessment Standardized Balance Assessment: Berg Balance Test Berg Balance Test Sit to Stand: Able to stand  independently using hands Standing Unsupported: Able to stand 2 minutes with supervision Sitting with Back Unsupported but Feet Supported on Floor or Stool: Able to sit 2 minutes under supervision Stand to Sit: Controls descent by using hands Transfers: Able to transfer safely, definite need of hands Standing Unsupported with Eyes Closed: Able to stand 10 seconds with supervision Standing Ubsupported with Feet Together: Able to place feet together independently and stand for 1 minute with supervision From Standing, Reach Forward with Outstretched Arm: Reaches forward but needs supervision From Standing Position, Pick up Object from Floor: Able to pick up shoe, needs supervision From Standing Position, Turn to Look Behind Over each Shoulder: Looks behind one side only/other side shows less weight shift Turn 360 Degrees: Able to turn 360 degrees  safely but slowly Standing Unsupported, Alternately Place Feet on Step/Stool: Able to complete >2 steps/needs minimal assist Standing Unsupported, One Foot in Front: Able to take small step independently and hold 30 seconds Standing on One Leg: Tries to lift leg/unable to hold 3 seconds but remains standing independently Total Score: 34 Dynamic Sitting Balance Dynamic Sitting - Balance Support: Feet supported Dynamic Sitting - Level of Assistance: 5: Stand by assistance Dynamic Standing Balance Dynamic Standing - Balance Support: Bilateral upper extremity supported Dynamic Standing - Level of Assistance: 5: Stand by assistance Extremity Assessment  RUE Assessment RUE Assessment: Within Functional Limits LUE Assessment LUE Assessment: Within Functional Limits RLE Assessment RLE Assessment: Exceptions to [facility] General Strength Comments: grossly, 4+/5 except R ankle DF 4-/5 LLE Assessment LLE Assessment: Within Functional Limits General Strength Comments: grossly, 4+/5    Refer to Care Plan for Long Term Goals  Recommendations for other services: Therapeutic Recreation  Pet therapy, Kitchen group, Stress management and Outing/community reintegration  Discharge Criteria: Patient will be discharged from PT if patient refuses treatment 3 consecutive times without medical reason, if treatment goals not met, if there is a change in medical status, if patient makes no progress towards goals or if patient is discharged from hospital.  The above assessment, treatment plan, treatment alternatives and goals were discussed and mutually agreed upon: by patient  Amador Cunas 05/18/2018, 5:24 PM

## 2018-05-18 NOTE — Evaluation (Signed)
Occupational Therapy Assessment and Plan  Patient Details  Name: Stephanie Hahn MRN: [id number] Date of Birth: 10/25/1945  OT Diagnosis: abnormal posture, cognitive deficits and muscle weakness (generalized) Rehab Potential: Rehab Potential (ACUTE ONLY): Good ELOS: 5-7 days   Today's Date: 05/18/2018 OT Individual Time: 8099-8338 OT Individual Time Calculation (min): 57 min     Problem List:  Patient Active Problem List   Diagnosis Date Noted  . Anoxic encephalopathy ([place])   . Atrial fibrillation with rapid ventricular response ([place])   . Hypertensive crisis   . AKI (acute kidney injury) ([place])   . Hypokalemia   . 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 Left leg pain 05/13/2018  . Essential hypertension 05/13/2018  . CAD (coronary artery disease) 05/13/2018  . Depression 05/13/2018  . Overdose opiate, accidental or unintentional, subsequent encounter 05/12/2018  . NSTEMI (non-ST elevated myocardial infarction) ([place]) 05/12/2018  . Aspiration pneumonia ([place]) 05/12/2018  . Chronic pain 05/12/2018  . Acute encephalopathy 05/12/2018  . Seizure-like activity ([place]) 05/12/2018  . Opiate overdose ([place]) 05/12/2018    Past Medical History: History reviewed. No pertinent past medical history. Past Surgical History: No past surgical history on file.  Assessment & Plan Clinical Impression: Patient is a 72 y.o. year old female with  chronic back pain maintained on Neurontin and Percocet, CAD, hypertension and hyperlipidemia. Per chart review and patient, patient lives alone independent prior to admission. One level home with 2 steps to entry. She does have family in the area that can assist. Presented to [facility] after being found down poorly responsive and hypoxic. Per EMS report saturations in the 50s with poor respiratory effort. She did respond to Narcan. Patient noted to be in atrial fibrillation with RVR placed on IV heparin. She was transferred to [facility] for further evaluation. UDS positive for opiates and benzos. Chemistry panel creatinine 2.40 from baseline 1.0, CK 240, WBC 17,000, lactic acid reported to be normal, troponin 1.78-3.7. Cranial CT scan reviewed, unremarkable for acute intracranial process. Chest x-ray showed no focal infiltrate. Unilateral pelvic films question very subtle nondisplaced left femoral neck fracture cannot be excluded. excluded no evidence of dislocation area follow-up MRI of bilateral globus pallidus restricted diffusion reflecting possible acute injury from hypoxia, metabolic disturbance. Additional scattered punctate foci of restricted diffusion in both cerebral hemispheres suggesting acute embolic infarctions. Carotid Dopplers with 40-59% left ICA stenosis. EEG indicated of severe encephalopathy. Elevated troponin felt to be related to demand ischemia. Echocardiogram with ejection fraction of 25% grade 2 diastolic dysfunction. Neurology follow-up maintained on Eliquis for both CVA prophylaxis and atrial fibrillation. Tolerating a regular diet. Therapy evaluations completed with recommendations of physical medicine rehabilitation consult. Patient was admitted for a comprehensive rehabilitation program. .  Patient transferred to CIR on 05/17/2018 .    Patient currently requires min with basic self-care skills secondary to muscle weakness, decreased cardiorespiratoy endurance, decreased coordination, decreased attention, decreased awareness, decreased problem solving and decreased safety awareness and decreased standing balance and decreased balance strategies.  Prior to hospitalization, patient could complete ADLs and IADLs with independent .  Patient will benefit from skilled intervention to decrease level of assist with basic self-care skills prior to discharge home with care partner.  Anticipate patient will require 24 hour supervision and follow up home health.  OT - End of Session Activity Tolerance: Decreased  this session Endurance Deficit: Yes Endurance Deficit Description: multiple rest breaks secondary to fatigue OT Assessment Rehab Potential (ACUTE ONLY): Good OT Barriers to  Discharge: Other (comments) OT Barriers to Discharge Comments: none known at this time OT Patient demonstrates impairments in the following area(s): Balance;Cognition;Endurance;Pain;Safety OT Basic ADL's Functional Problem(s): Grooming;Bathing;Dressing;Toileting OT Advanced ADL's Functional Problem(s): Simple Meal Preparation;Laundry OT Transfers Functional Problem(s): Toilet;Tub/Shower OT Additional Impairment(s): None OT Plan OT Intensity: Minimum of 1-2 x/day, 45 to 90 minutes OT Frequency: 5 out of 7 days OT Duration/Estimated Length of Stay: 5-7 days OT Treatment/Interventions: Balance/vestibular training;DME/adaptive equipment instruction;Patient/family education;Therapeutic Activities;Functional electrical stimulation;Therapeutic Exercise;Psychosocial support;Community reintegration;Functional mobility training;Self Care/advanced ADL retraining;Discharge planning;Neuromuscular re-education;UE/LE Coordination activities;Pain management OT Self Feeding Anticipated Outcome(s): n/a OT Basic Self-Care Anticipated Outcome(s): s OT Toileting Anticipated Outcome(s): s OT Bathroom Transfers Anticipated Outcome(s): s OT Recommendation Recommendations for Other Services: Other (comment)(none at this time) Patient destination: Home Follow Up Recommendations: Home health OT Equipment Recommended: To be determined   Skilled Therapeutic Intervention Upon entering the room, pt supine in bed with no c/o pain this session. OT educated pt on OT purpose, POC, and goals and pt verbalized understanding. Pt with flat affect throughout session. Pt very impulsive as well with mod cuing for safety awareness with transitions and ambulation. Min hand held assistance ambulating to bathroom to shower this session with min A standing  balance. Pt seated on shower chair with sit <>stand to wash buttocks and peri area. Pt returning to sit EOB to don clothing with increased time and min A for standing balance. Pt reports being fatigued and returning to supine at end of session. Bed alarm activated and call bell within reach.   OT Evaluation Precautions/Restrictions  Precautions Precautions: Fall Restrictions Weight Bearing Restrictions: No  Pain Pain Assessment Pain Scale: 0-10 Pain Score: 8  Pain Type: Acute pain Pain Location: Rib cage Pain Orientation: Right Pain Descriptors / Indicators: Aching Pain Onset: On-going Pain Intervention(s): RN made aware;Repositioned Multiple Pain Sites: No Home Living/Prior Functioning Home Living Family/patient expects to be discharged to:: Private residence Living Arrangements: Other relatives Available Help at Discharge: Family, Available 24 hours/day Type of Home: House Home Access: Stairs to enter Technical brewer of Steps: 2 Entrance Stairs-Rails: None Home Layout: One level Bathroom Shower/Tub: Optometrist: Yes  Lives With: Other (Comment)(lives with brother) Prior Function Level of Independence: Independent with basic ADLs, Independent with homemaking with ambulation, Independent with gait, Independent with transfers Comments: ADLs, IADLs, and driving Vision Baseline Vision/History: Wears glasses Wears Glasses: Reading only Patient Visual Report: No change from baseline Cognition Overall Cognitive Status: Impaired/Different from baseline Arousal/Alertness: Awake/alert Orientation Level: Person;Place Year: 2019 Month: December Day of Week: Correct Memory: Impaired Memory Impairment: Storage deficit;Decreased recall of new information Immediate Memory Recall: Sock;Blue;Bed Memory Recall: Sock;Blue;Bed Memory Recall Sock: Without Cue Memory Recall Blue: Without Cue Memory Recall Bed: Without  Cue Behaviors: Impulsive Sensation Sensation Light Touch: Appears Intact Hot/Cold: Appears Intact Proprioception: Appears Intact Stereognosis: Not tested Coordination Gross Motor Movements are Fluid and Coordinated: Yes Fine Motor Movements are Fluid and Coordinated: Yes Coordination and Movement Description: decreased dexterity - artifical nails increasing difficulty as well Motor  Motor Motor: Within Functional Limits Motor - Skilled Clinical Observations: generalized weakness Mobility  Bed Mobility Bed Mobility: Sit to Supine;Supine to Sit Supine to Sit: Contact Guard/Touching assist Sit to Supine: Contact Guard/Touching assist Transfers Sit to Stand: Contact Guard/Touching assist Stand to Sit: Contact Guard/Touching assist  Trunk/Postural Assessment  Cervical Assessment Cervical Assessment: Exceptions to WFL(forward head) Thoracic Assessment Thoracic Assessment: Exceptions to WFL(kyphotic) Lumbar Assessment Lumbar Assessment: Exceptions to WFL(posterior pelvic tilt) Postural Control  Postural Control: Deficits on evaluation(delayed)  Balance Balance Balance Assessed: Yes Dynamic Sitting Balance Dynamic Sitting - Balance Support: During functional activity Dynamic Sitting - Level of Assistance: 5: Stand by assistance Dynamic Standing Balance Dynamic Standing - Balance Support: During functional activity Dynamic Standing - Level of Assistance: 4: Min assist;5: Stand by assistance Extremity/Trunk Assessment RUE Assessment RUE Assessment: Within Functional Limits LUE Assessment LUE Assessment: Within Functional Limits     Refer to Care Plan for Long Term Goals  Recommendations for other services: None    Discharge Criteria: Patient will be discharged from OT if patient refuses treatment 3 consecutive times without medical reason, if treatment goals not met, if there is a change in medical status, if patient makes no progress towards goals or if patient is  discharged from hospital.  The above assessment, treatment plan, treatment alternatives and goals were discussed and mutually agreed upon: by patient  Gypsy Decant 05/18/2018, 1:49 PM

## 2018-05-19 ENCOUNTER — Inpatient Hospital Stay (HOSPITAL_COMMUNITY): Payer: Federal, State, Local not specified - PPO | Admitting: Speech Pathology

## 2018-05-19 ENCOUNTER — Inpatient Hospital Stay (HOSPITAL_COMMUNITY): Payer: Federal, State, Local not specified - PPO

## 2018-05-19 ENCOUNTER — Inpatient Hospital Stay (HOSPITAL_COMMUNITY): Payer: Federal, State, Local not specified - PPO | Admitting: Physical Therapy

## 2018-05-19 ENCOUNTER — Inpatient Hospital Stay (HOSPITAL_COMMUNITY): Payer: Federal, State, Local not specified - PPO | Admitting: Occupational Therapy

## 2018-05-19 MED ORDER — TRAMADOL HCL 50 MG PO TABS
50.0000 mg | ORAL_TABLET | Freq: Four times a day (QID) | ORAL | Status: DC | PRN
  Administered 2018-05-19 – 2018-05-24 (×16): 50 mg via ORAL
  Filled 2018-05-19 (×16): qty 1

## 2018-05-19 NOTE — Progress Notes (Signed)
Speech Language Pathology Daily Session Note  Patient Details  Name: Stephanie Hahn MRN: [id number] Date of Birth: 12-03-1945  Today's Date: 05/19/2018 SLP Individual Time: [id number] SLP Individual Time Calculation (min): 25 min  Short Term Goals: Week 1: SLP Short Term Goal 1 (Week 1): STG=LTG due to ELOS  Skilled Therapeutic Interventions:  Pt was seen for skilled ST targeting cognitive goals.  Pt was in bed upon arrival, did not want to get up for therapy but was eventually convinced to get to recliner.  SLP facilitated the session with medication management tasks from ALFA standardized assessment targeting goals for problem solving. Pt needed min assist for attention to detail and task organization when placing pills into a 4x daily medication chart.  Pt repeatedly stated that she is ready to go home and appears to be minimally engaged in therapy.  Pt was left in recliner with visitors at bedside.  Chair alarm was set and call bell was left within reach.  Continue per current plan of care.     Pain Pain Assessment Pain Scale: 0-10 Pain Score: 0-No pain   Therapy/Group: Individual Therapy  Stephanie Hahn, Stephanie Hahn 05/19/2018, 12:53 PM

## 2018-05-19 NOTE — Progress Notes (Signed)
[facility] PHYSICAL MEDICINE & REHABILITATION PROGRESS NOTE   Subjective/Complaints: Pt wants to go home, sitting at EOB, states brother is home with her.  We discussed need to improve independence and reduce fall risk ROS- neg CP, SOB, N/V/D   Objective:   No results found. Recent Labs    05/18/18 0712  WBC 8.7  HGB 12.1  HCT 40.2  PLT 194   Recent Labs    05/17/18 0214 05/18/18 0712  NA 140 137  K 3.1* 3.5  CL 98 101  CO2 27 25  GLUCOSE 116* 106*  BUN 10 11  CREATININE 1.13* 1.24*  CALCIUM 8.8* 9.2    Intake/Output Summary (Last 24 hours) at 05/19/2018 0720 Last data filed at 05/18/2018 1800 Gross per 24 hour  Intake 480 ml  Output -  Net 480 ml     Physical Exam: Vital Signs Blood pressure (!) 132/57, pulse 64, temperature 98.5 F (36.9 C), resp. rate 18, height 5\' 4"  (1.626 m), weight 90.8 kg, SpO2 97 %.  General: No acute distress Mood and affect are appropriate Heart: Regular rate and rhythm no rubs murmurs or extra sounds Lungs: Clear to auscultation, breathing unlabored, no rales or wheezes Abdomen: Positive bowel sounds, soft nontender to palpation, nondistended Extremities: No clubbing, cyanosis, or edema Skin: No evidence of breakdown, no evidence of rash, no ecchymosis over flank,  Neurologic: Cranial nerves II through XII intact, motor strength is 5/5 in bilateral deltoid, bicep, tricep, grip, hip flexor, knee extensors, ankle dorsiflexor and plantar flexor  Musculoskeletal: Full range of motion in all 4 extremities. No joint swelling Back no tenderness to palpation, no pain with sup to sit   Assessment/Plan: 1. 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  Physiatrist is providing close team supervision and 24 hour management of active medical problems listed below.  Physiatrist and rehab team continue to  assess barriers to discharge/monitor patient progress toward functional and medical goals  Care Tool:  Bathing    Body parts bathed by patient: Right arm, Left arm, Chest, Abdomen, Front perineal area, Buttocks, Right upper leg, Left upper leg, Right lower leg, Left lower leg, Face         Bathing assist Assist Level: Contact Guard/Touching assist     Upper Body Dressing/Undressing Upper body dressing   What is the patient wearing?: Hospital gown only    Upper body assist Assist Level: Contact Guard/Touching assist    Lower Body Dressing/Undressing Lower body dressing      What is the patient wearing?: Underwear/pull up     Lower body assist Assist for lower body dressing: Contact Guard/Touching assist     Toileting Toileting    Toileting assist Assist for toileting: Supervision/Verbal cueing     Transfers Chair/bed transfer  Transfers assist     Chair/bed transfer assist level: Supervision/Verbal cueing     Locomotion Ambulation   Ambulation assist      Assist level: Contact Guard/Touching assist Assistive device: Walker-rolling Max distance: 150   Walk 10 feet activity   Assist     Assist level: Contact Guard/Touching assist Assistive device: Walker-rolling   Walk 50 feet activity   Assist    Assist level: Contact Guard/Touching assist Assistive device: Walker-rolling    Walk 150 feet activity   Assist    Assist level: Contact Guard/Touching assist Assistive device: Walker-rolling    Walk 10 feet on uneven  surface  activity   Assist     Assist level: Contact Guard/Touching assist Assistive device: PhotographerWalker-rolling   Wheelchair     Assist   Type of Wheelchair: Manual    Wheelchair assist level: Contact Guard/Touching assist Max wheelchair distance: 100    Wheelchair 50 feet with 2 turns activity    Assist        Assist Level: Contact Guard/Touching assist   Wheelchair 150 feet activity     Assist  Wheelchair 150 feet activity did not occur: Safety/medical concerns        Medical Problem List and Plan: 1.Decreased functional mobilitysecondary to bilateral cardioembolic infarcts  as well as  Hypoxic/ichemic encephalopathy due to potentialunintentional drug overdose CIR PT.OT- do not anticipate a long LOS- at Sup level currently 2. DVT Prophylaxis/Anticoagulation: Eliquis 3. Pain Management/chronic back pain:Tylenol as needed. Patient had been maintained on Neurontin and Percocet prior to admission for chronic back pain discontinued due to encephalopathy. Patient did receive Narcan.Pt asking for pain meds, fo back pain exam is normal will avoid schedule 2  Narcotics , pt may have addiction issues, wants to tgo home appears anxious today but has no pain with movement     May increase tramadol to q6 h                   4. Mood:Provide emotional supportas needed 5. Neuropsych: This patientisnotcapable of making decisions onherown behalf. 6. Skin/Wound Care:Routine skin checks 7. Fluids/Electrolytes/Nutrition:Routine ins and outs with follow-up chemistries. Encourage po 8. A. Fib with RVR. Cardiac rate controlled. Elevated troponin felt to be related to demand ischemia. Continue Eliquis 9. Hypertension. HCTZ 12.5 mg daily, Toprol 100 mg daily. Monitor with increased mobility 10. Hyperthyroidism. Continue Tapazole 11. Acute kidney injury. Follow-up chemistries. Baseline creatinine 1.0 12. Acute left leg pain. X-rays and imaging revealed possible subtle nondisplaced left femoral neck fracture cannot be excluded, no complaints of LLE this am, no pain with AROM LLE. May need to pursue CT scan if any further complaints 13. Hyperlipidemia. Lipitor/Zetia    LOS: 2 days A FACE TO FACE EVALUATION WAS PERFORMED  Erick Colacendrew E Kirsteins 05/19/2018, 7:20 AM

## 2018-05-19 NOTE — Progress Notes (Signed)
Returned to patient and re-attempted PT this morning; she continues to refuse participation in therapy this morning stating "I just am having a day where I don't want to do anything!". Unable to convince patient to participate in therapy this session, further education provided regarding importance of participation with therapies in this setting. 60 minutes of skilled therapy time missed due to patient refusal.  Nedra HaiKristen Unger PT, DPT, CBIS  Supplemental Physical Therapist [facility]    Pager [phone] Acute Rehab Office [phone]

## 2018-05-19 NOTE — Progress Notes (Signed)
Attempted to work with patient this morning, she adamantly refused stating "I just don't feel like doing anything today".  Provided extensive education regarding importance of participation in therapy as well as wide range of interventions PT can perform since she is declining gait this morning due to side pain. Family assisted in attempting to encourage patient to participate however she continued to refuse PT. RN aware of request for pain medication. Plan to attempt again later in morning.   Nedra HaiKristen Unger PT, DPT, CBIS  Supplemental Physical Therapist [facility]    Pager [phone] Acute Rehab Office [phone]

## 2018-05-19 NOTE — Progress Notes (Signed)
Occupational Therapy Session Note  Patient Details  Name: Stephanie Hahn MRN: [id number] Date of Birth: 12-28-1945  Today's Date: 05/19/2018 OT Individual Time: 0730-0810 OT Individual Time Calculation (min): 40 min    Short Term Goals: Week 1:  OT Short Term Goal 1 (Week 1): STGs=LTGS secondary to estimated short LOS  Skilled Therapeutic Interventions/Progress Updates:    Pt presenting supine in bed with flat affect and asking to go home, c/o 3/10 pain in R flank- no need for intervention initially. Pt completed SPT bed > w.c with CGA. At sink pt able to complete UB/LB bathing with (S). Pt resistant to using RW and furniture walking instead. Edu provided re fall prevention at home, unclear as to pt receptiveness. Pt completed oral care at sink in standing with (S). Pt reported increasing fatigue and requested to return to bed. While sitting EOB pt was provided with energy conservation handout and given overview orally of principles. Pt minimally receptive to edu and handout was left for pt to review independently. Pt was left supine with all needs met, bed alarm set and telesitter on. Rn notified of pt request for pain medication at end of session.   Therapy Documentation Precautions:  Precautions Precautions: Fall Restrictions Weight Bearing Restrictions: No   Pain: Pain Assessment Pain Scale: 0-10 Pain Score: 3  Pain Type: Acute pain Pain Location: Flank Pain Orientation: Right Pain Descriptors / Indicators: Aching Pain Frequency: Intermittent Pain Onset: On-going Patients Stated Pain Goal: 1 Pain Intervention(s): Repositioned   Therapy/Group: Individual Therapy  Curtis Sites 05/19/2018, 8:31 AM

## 2018-05-19 NOTE — Progress Notes (Signed)
Occupational Therapy Session Note  Patient Details  Name: Stephanie Hahn MRN: [id number] Date of Birth: 11-09-1945  Today's Date: 05/19/2018 OT Individual Time: 1435-1515 OT Individual Time Calculation (min): 40 min    Short Term Goals: Week 1:  OT Short Term Goal 1 (Week 1): STGs=LTGS secondary to estimated short LOS  Skilled Therapeutic Interventions/Progress Updates:    Therapist arrived at 1300 for scheduled OT session. Pt in supine, awake. She  Therapist returned and with encouragement pt willing to participate in session. She ambulated throughout unit with supervision using RW. Trialed use of no AD< pt requiring HHA and much slower ambulation speed noted, increased independence and safety with use of AD, pt educated regarding recommendations.  In ADL apartment, completed simulated tub/shower transfer. Completed with supervision following demonstration for technique from therapist. Pt believes this will work in home setting. Completed sit<>stands from low soft surface couch x3 during session with supervision to standing at RW. She completed simple meal prep activity of making scrambled egg at stove level, overall supervision with mod cuing for safety awareness and RW management in functional context. Required x2 extended seated rest break during meal prep activity.  Pt ambulated back to room at end of session, refused request from therapist to stay sitting in recliner despite education of benefits of OOB. Pt left in supine, all needs in reach and bed alarm on.    Throughout session, pt reluctant to follow VCs from therapist for proper RW management, safety awareness etc., no follow through with cuing/education during session from each trial.   Therapy Documentation Precautions:  Precautions Precautions: Fall Restrictions Weight Bearing Restrictions: No Pain: Pain Assessment Pain Scale: 0-10 Pain Score: No/denies pain   Therapy/Group: Individual Therapy  [facility] W[facility]ley  L 05/19/2018, 7:12 AM

## 2018-05-19 NOTE — Progress Notes (Signed)
Social Work Patient ID: Stephanie Hahn, female   DOB: 1945/09/15, 72 y.o.   MRN: [id number]   CSW met with pt to update her on team conference discussion from yesterday, as well as to complete assessment.  Pt expected to be here 5 to 7 days.  Family reportedly to be with her 24/7 at d/c, but could not confirm this via telephone.  CSW will continue to reach out to family about this.  Pt with some medical needs still that may keep her here even as she approaches goal level, per PA Dan Angiulli.

## 2018-05-20 ENCOUNTER — Inpatient Hospital Stay (HOSPITAL_COMMUNITY): Payer: Medicare Other

## 2018-05-20 ENCOUNTER — Inpatient Hospital Stay (HOSPITAL_COMMUNITY): Payer: Federal, State, Local not specified - PPO | Admitting: Occupational Therapy

## 2018-05-20 ENCOUNTER — Inpatient Hospital Stay (HOSPITAL_COMMUNITY): Payer: Federal, State, Local not specified - PPO

## 2018-05-20 ENCOUNTER — Inpatient Hospital Stay (HOSPITAL_COMMUNITY): Payer: Federal, State, Local not specified - PPO | Admitting: Physical Therapy

## 2018-05-20 NOTE — Progress Notes (Signed)
Speech Language Pathology Daily Session Note  Patient Details  Name: Stephanie Hahn MRN: [id number] Date of Birth: 1946-03-27  Today's Date: 05/20/2018 SLP Individual Time: 1530-1600 SLP Individual Time Calculation (min): 30 min  Short Term Goals: Week 1: SLP Short Term Goal 1 (Week 1): STG=LTG due to ELOS  Skilled Therapeutic Interventions:  Skilled ST interventions included executive function. Outcomes: Pt observed lying in bed alert and required encouragement in order to participate in today's session. Education provided re: importance of medication management for home in order to enhance functional independence in home environment. SLP assisted with development of medication list. Pt answered wh-ques re: list with ~50% accuracy. Patient demonstrated flat affect and decreased attention to task, looking at the TV for the majority of the session. Med list left in patient's second drawer of bedside table. Education provided re: working on medication management tasks in ST sessions. Pt had no comment prior to SLP's departure. Pt left in bed with bed alarm set and call bell within reach.   Pain Pain Assessment Pain Scale: 0-10 Pain Score: 5  Pain Type: Acute pain Pain Location: Flank Pain Orientation: Right Pain Descriptors / Indicators: Aching;Sore Pain Onset: On-going Patients Stated Pain Goal: 0 Pain Intervention(s): Emotional support;Ambulation/increased activity Multiple Pain Sites: No  Therapy/Group: Individual Therapy  Everlean Pattersonancy Shubham Thackston 05/20/2018, 4:30 PM

## 2018-05-20 NOTE — Progress Notes (Signed)
[facility] PHYSICAL MEDICINE & REHABILITATION PROGRESS NOTE   Subjective/Complaints: C/o right sided pain, chronic, , no pain with meals, no pain with deep breath , no pain with urination     ROS- neg CP, SOB, N/V/D   Objective:   No results found. Recent Labs    05/18/18 0712  WBC 8.7  HGB 12.1  HCT 40.2  PLT 194   Recent Labs    05/18/18 0712  NA 137  K 3.5  CL 101  CO2 25  GLUCOSE 106*  BUN 11  CREATININE 1.24*  CALCIUM 9.2    Intake/Output Summary (Last 24 hours) at 05/20/2018 0706 Last data filed at 05/19/2018 1843 Gross per 24 hour  Intake 462 ml  Output -  Net 462 ml     Physical Exam: Vital Signs Blood pressure (!) 141/88, pulse 71, temperature 98.2 F (36.8 C), resp. rate 18, height 5\' 4"  (1.626 m), weight 91.7 kg, SpO2 98 %.  General: No acute distress Mood and affect are appropriate Heart: Regular rate and rhythm no rubs murmurs or extra sounds Lungs: Clear to auscultation, breathing unlabored, no rales or wheezes Abdomen: Positive bowel sounds, soft nontender to palpation, nondistended Extremities: No clubbing, cyanosis, or edema Skin: No evidence of breakdown, no evidence of rash, no ecchymosis over flank,  Neurologic: Cranial nerves II through XII intact, motor strength is 5/5 in bilateral deltoid, bicep, tricep, grip, hip flexor, knee extensors, ankle dorsiflexor and plantar flexor  Musculoskeletal: mild tenderness Lumbar parapinal right side Back no tenderness to palpation, no pain with sup to sit   Assessment/Plan: 1. 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  Physiatrist is providing close team supervision and 24 hour management of active medical problems listed below.  Physiatrist and rehab team continue to assess barriers to discharge/monitor patient progress toward functional and medical  goals  Care Tool:  Bathing    Body parts bathed by patient: Right arm, Left arm, Chest, Abdomen, Front perineal area, Buttocks         Bathing assist Assist Level: Supervision/Verbal cueing     Upper Body Dressing/Undressing Upper body dressing   What is the patient wearing?: Hospital gown only    Upper body assist Assist Level: Contact Guard/Touching assist    Lower Body Dressing/Undressing Lower body dressing      What is the patient wearing?: Underwear/pull up     Lower body assist Assist for lower body dressing: Supervision/Verbal cueing     Toileting Toileting    Toileting assist Assist for toileting: Supervision/Verbal cueing     Transfers Chair/bed transfer  Transfers assist     Chair/bed transfer assist level: Supervision/Verbal cueing     Locomotion Ambulation   Ambulation assist      Assist level: Contact Guard/Touching assist Assistive device: Walker-rolling Max distance: 150   Walk 10 feet activity   Assist     Assist level: Contact Guard/Touching assist Assistive device: Walker-rolling   Walk 50 feet activity   Assist    Assist level: Contact Guard/Touching assist Assistive device: Walker-rolling    Walk 150 feet activity   Assist    Assist level: Contact Guard/Touching assist Assistive device: Walker-rolling    Walk 10 feet on uneven surface  activity   Assist     Assist level: Contact Guard/Touching assist Assistive device: Walker-rolling   Wheelchair     Assist   Type of Wheelchair:  Manual    Wheelchair assist level: Contact Guard/Touching assist Max wheelchair distance: 100    Wheelchair 50 feet with 2 turns activity    Assist        Assist Level: Contact Guard/Touching assist   Wheelchair 150 feet activity     Assist Wheelchair 150 feet activity did not occur: Safety/medical concerns        Medical Problem List and Plan: 1.Decreased functional mobilitysecondary to  bilateral cardioembolic infarcts  as well as  Hypoxic/ichemic encephalopathy due to potentialunintentional drug overdose CIR PT.OT- do not anticipate a long LOS- at Sup level currently 2. DVT Prophylaxis/Anticoagulation: Eliquis 3. Pain Management/chronic back pain:Tylenol as needed. Patient had been maintained on Neurontin and Percocet prior to admission for chronic back pain discontinued due to encephalopathy. Patient did receive Narcan.Mild Right lumbar paraspinal tenderness , pt may have addiction issues, wants to tgo home appears anxious today but has no pain with movement     May increase tramadol to q6 h                   4. Mood:Provide emotional supportas needed 5. Neuropsych: This patientisnotcapable of making decisions onherown behalf. 6. Skin/Wound Care:Routine skin checks 7. Fluids/Electrolytes/Nutrition:Routine ins and outs with follow-up chemistries. Encourage po 8. A. Fib with RVR. Cardiac rate controlled. Elevated troponin felt to be related to demand ischemia. Continue Eliquis 9. Hypertension. HCTZ 12.5 mg daily, Toprol 100 mg daily. Monitor with increased mobility Vitals:   05/19/18 2340 05/20/18 0530  BP: 138/72 (!) 141/88  Pulse: 74 71  Resp: 18 18  Temp: 98.1 F (36.7 C) 98.2 F (36.8 C)  SpO2: 95% 98%   10. Hyperthyroidism. Continue Tapazole 11. Acute kidney injury. Follow-up chemistries. Baseline creatinine 1.0 12. Acute left leg pain. Resolved  X-rays and imaging revealed possible subtle nondisplaced left femoral neck fracture cannot be excluded, no complaints of LLE this am, no pain with AROM LLE13. Hyperlipidemia. Lipitor/Zetia  13. Chronic low back pain , pt was on  percocet at home- will check xray given age and pt found down PTA ? fall  LOS: 3 days A FACE TO FACE EVALUATION WAS PERFORMED  Erick Colace 05/20/2018, 7:06 AM

## 2018-05-20 NOTE — IPOC Note (Signed)
Overall Plan of Care [facility]) Patient Details Name: Stephanie Hahn MRN: [id number] DOB: Oct 19, 1945  Admitting Diagnosis: <principal problem not specified>  Hospital Problems: Active Problems:   Acute encephalopathy   Anoxic encephalopathy (HCC)     Functional Problem List: Nursing Behavior, Bladder, Bowel, Medication Management, Safety  PT Balance, Behavior, Endurance, Motor, Pain, Safety, Sensory  OT Balance, Cognition, Endurance, Pain, Safety  SLP Cognition  TR         Basic ADL's: OT Grooming, Bathing, Dressing, Toileting     Advanced  ADL's: OT Simple Meal Preparation, Laundry     Transfers: PT Bed Mobility, Bed to Chair, Customer service manager, Tub/Shower     Locomotion: PT Ambulation, Psychologist, prison and probation services, Stairs     Additional Impairments: OT None  SLP Social Cognition   Problem Solving, Memory, Attention, Awareness  TR      Anticipated Outcomes Item Anticipated Outcome  Self Feeding n/a  Swallowing      Basic self-care  s  Toileting  s   Bathroom Transfers s  Bowel/Bladder  LBM 05/17/18, CONT B/B   Transfers  modified independent with LRAD  Locomotion  supervision up to 150' with LRAD  Communication     Cognition  Supervision  Pain  6/10, TOLERABLE PAIN 6  Safety/Judgment  CALL LIGHT WITHIN REACH, BED/CHAIR ALARM, TELESITTER   Therapy Plan: PT Intensity: Minimum of 1-2 x/day ,45 to 90 minutes PT Frequency: 5 out of 7 days PT Duration Estimated Length of Stay: 5-7 days OT Intensity: Minimum of 1-2 x/day, 45 to 90 minutes OT Frequency: 5 out of 7 days OT Duration/Estimated Length of Stay: 5-7 days SLP Intensity: Minumum of 1-2 x/day, 30 to 90 minutes SLP Frequency: 3 to 5 out of 7 days SLP Duration/Estimated Length of Stay: 5-7 days     Team Interventions: Nursing Interventions Patient/Family Education, Pain Management, Medication Management  PT interventions Ambulation/gait training, Community reintegration, DME/adaptive equipment instruction,  Neuromuscular re-education, Psychosocial support, Stair training, UE/LE Strength taining/ROM, Wheelchair propulsion/positioning, Warden/ranger, Discharge planning, Pain management, Therapeutic Activities, UE/LE Coordination activities, Skin care/wound management, Cognitive remediation/compensation, Disease management/prevention, Functional mobility training, Patient/family education, Splinting/orthotics, Therapeutic Exercise, Visual/perceptual remediation/compensation  OT Interventions Warden/ranger, DME/adaptive equipment instruction, Patient/family education, Therapeutic Activities, Functional electrical stimulation, Therapeutic Exercise, Psychosocial support, Community reintegration, Functional mobility training, Self Care/advanced ADL retraining, Discharge planning, Neuromuscular re-education, UE/LE Coordination activities, Pain management  SLP Interventions Cognitive remediation/compensation, Cueing hierarchy, Environmental controls, Internal/external aids, Patient/family education  TR Interventions    SW/CM Interventions Psychosocial Support, Patient/Family Education, Discharge Planning   Barriers to Discharge MD  Medical stability, Home enviroment access/loayout and chronic pain with hx of overdose   Nursing      PT Inaccessible home environment, Decreased caregiver support, Home environment access/layout, Lack of/limited family support    OT Other (comments) none known at this time  SLP      SW       Team Discharge Planning: Destination: PT-Home ,OT- Home , SLP-Home Projected Follow-up: PT-Home health PT, OT-  Home health OT, SLP-Home Health SLP, 24 hour supervision/assistance Projected Equipment Needs: PT-To be determined, OT- To be determined, SLP-None recommended by SLP Equipment Details: PT- , OT-  Patient/family involved in discharge planning: PT- Patient,  OT-Patient, SLP-Patient  MD ELOS: 7-10d Medical Rehab Prognosis:  Excellent Assessment:   72 year old right-handed female with history of chronic back pain maintained on Neurontin and Percocet, CAD, hypertension and hyperlipidemia. Per chart review and patient, patient lives alone independent prior to admission. One level  home with 2 steps to entry. She does have family in the area that can assist. [facility] after being found down poorly responsive and hypoxic. Per EMS report saturations in the 50s with poor respiratory effort. She did respond to Narcan. Patient noted to be in atrial fibrillation with RVR placed on IV heparin. [facility]. UDS positive for opiates and benzos. Chemistry panel creatinine 2.40 from baseline 1.0, CK 240, WBC 17,000, lactic acid reported to be normal, troponin 1.78-3.7. Cranial CT scan reviewed, unremarkable for acute intracranial process. Chest x-ray showed no focal infiltrate. Unilateral pelvic films question very subtle nondisplaced left femoral neck fracture cannot be excluded. excluded no evidence of dislocation area follow-up MRI of bilateral globus pallidus restricted diffusion reflecting possible acute injury from hypoxia, metabolic disturbance. Additional scattered punctate foci of restricted diffusion in both cerebral hemispheres suggesting acute embolic infarctions. Carotid Dopplers with 40-59% left ICA stenosis. EEG indicated of severe encephalopathy. Elevated troponin felt to be related to demand ischemia. Echocardiogram with ejection fraction of 65% grade 2 diastolic dysfunction. Neurology follow-up maintained on Eliquis for both CVA prophylaxis and atrial fibrillation. Tolerating a regular diet   Now requiring 24/7 Rehab RN,MD, as well as CIR level PT, OT and SLP.  Treatment team will focus on ADLs and mobility with goals set at Sup See Team Conference Notes for weekly updates to the plan of care

## 2018-05-20 NOTE — Progress Notes (Addendum)
Physical Therapy Session Note  Patient Details  Name: Stephanie Hahn MRN: [id number] Date of Birth: [birth year]  Today's Date: 05/20/2018 PT Individual Time: 1330-1410 PT Individual Time Calculation (min): 40 min   Short Term Goals: Week 1:  PT Short Term Goal 1 (Week 1): short term goals = long term goals due to ELOS   Skilled Therapeutic Interventions/Progress Updates:    Patient received in bed, daughter present and assisted in participating in therapy; patient attempted to refuse but able to be convinced with Max encouragement from therapist. Initially discussed and planned to attempt gait with no device and had prepared WC for follow, however when starting to ambulate patient grabbed handles of unlocked WC and used it to walk with, declining walking without device in hallway. Ambulated to small PT gym where she rested on mat table, then able to ambulate approximately 84f without device and min guard. Performed standing exercises with min guard, patient stopped exercises prematurely and stated "I can't do this" despite having just done them with therapist. Gait trained another 1527fholding onto WC, then transported toLindenn [facility] Nustep. Able to perform stand-pivot transfer to Nustep with min guard, tolerated riding Nustep 7 minutes with B LEs only on resistance 3 with verbal encouragement from PT, then declined further participation in PT without medical reason. [facility] bed with min guard and no device, and was left in bed with all needs met and bed alarm active this afternoon. 20 minutes of skilled therapy time missed this afternoon due to patient refusal to complete session.   Therapy Documentation Precautions:  Precautions Precautions: Fall Restrictions Weight Bearing Restrictions: No General: PT Amount of Missed Time (min): 20 Minutes PT Missed Treatment Reason: Patient unwilling to participate Vital  Signs:  Pain: Pain Assessment Pain Scale: 0-10 Pain Score: 5  Pain Type: Acute pain Pain Location: Flank Pain Orientation: Right Pain Descriptors / Indicators: Aching;Sore Pain Onset: On-going Patients Stated Pain Goal: 0 Pain Intervention(s): Emotional support;Ambulation/increased activity Multiple Pain Sites: No    Therapy/Group: Individual Therapy  KrDeniece ReeT, DPT, CBIS  Supplemental Physical Therapist [facility]  Pager [phone] Rehab Office [phone] 05/20/2018, 2:17 PM

## 2018-05-20 NOTE — Progress Notes (Signed)
Occupational Therapy Session Note  Patient Details  Name: Stephanie Hahn MRN: [id number] Date of Birth: 10-13-1945  Today's Date: 05/20/2018 OT Individual Time: [id number] OT Individual Time Calculation (min): 35 min    Short Term Goals: Week 1:  OT Short Term Goal 1 (Week 1): STGs=LTGS secondary to estimated short LOS  Skilled Therapeutic Interventions/Progress Updates:    1;1. Pt requires max encouragement to participate and request pain mediation for pain in R side (not rated). Pt agrees to sweeping, vacuuming and laundry tasks as these are things pt needs to be able to do at home. After given specific areas of rooms to sweep/vacuum pt only completes half and takes seated rest break stating "your stuff is heavier than mine" therefore justifying not completing. Pt educated on energy conservation strategies cord management with vacuum and use of long handled equipment to decrease bending when completing ADLS to decrease R side pain. Exited session with pt seated EOB, alarm on, safety mats in place, all needs in reach and RN aware of positioning. Pt finished with activities and pt unwilling to complete any other tx and missed 10 min total of skilled OT d/t refusal to participate.  Therapy Documentation Precautions:  Precautions Precautions: Fall Restrictions Weight Bearing Restrictions: No General: General OT Amount of Missed Time: 15 Minutes Vital Signs: Therapy Vitals Temp: 98.2 F (36.8 C) Pulse Rate: 71 Resp: 18 BP: (!) 141/88 Patient Position (if appropriate): Lying Oxygen Therapy SpO2: 98 % O2 Device: Room Air Pain: Pain Assessment Pain Scale: 0-10 Pain Score: 7  Pain Type: Acute pain Pain Location: Flank Pain Orientation: Right Pain Descriptors / Indicators: Aching;Discomfort Pain Frequency: Intermittent Pain Onset: Gradual Patients Stated Pain Goal: 3 Pain Intervention(s): Medication (See eMAR) Multiple Pain Sites: No ADL:   Vision   Perception    Praxis    Exercises:   Other Treatments:     Therapy/Group: Individual Therapy  [name] 05/20/2018, 8:56 AM

## 2018-05-20 NOTE — Progress Notes (Signed)
Occupational Therapy Session Note  Patient Details  Name: Stephanie Hahn MRN: [id number] Date of Birth: 1945-07-11  Today's Date: 05/20/2018 OT Individual Time: 0700-0745 OT Individual Time Calculation (min): 45 min  and Today's Date: 05/20/2018 OT Missed Time: 15 Minutes Missed Time Reason: Patient unwilling/refused to participate without medical reason   Short Term Goals: Week 1:  OT Short Term Goal 1 (Week 1): STGs=LTGS secondary to estimated short LOS  Skilled Therapeutic Interventions/Progress Updates:    Pt seen for OT ADL bathing/dressing session. Pt asleep in supine upon arrival. Required extensive encouragement and education for particiapation in therapy session, initially refusing tx.  With encouragement, pt willing to shower. She ambulated throughout session with steadying assist, no AD. Toileting tasks completed with supervision. Pt requiring VCs for proper sequencing and safety awareness during doffing of clothing and transitioning into shower. She bathed seated on BSC in shower, VCs for attention to all areas and safety as pt attempting to walk out of shower while water still running and covered in soap. She donned hospital gown, underwear, and socks seated EOB with supervision.  She ambulated within room to retrieve dirty laundry items from floor, guarding assist when bending over to retrieve items. Required seated rest break before then ambulating to sink and completing grooming tasks in standing.  Pt's breakfast tray then arrived and pt requesting to eat breakfast, refusing any additional therapy at this time. Pt left seated EOB set-up with meal tray, all needs in reach and bed alarm on.  Extensive time and discussion with pt regarding benefits and importance of participation in therapies,CLOF vs PLOF, benefits of OOB, high fall risk and d/c planning.   Therapy Documentation Precautions:  Precautions Precautions: Fall Restrictions Weight Bearing Restrictions: No Pain: Pain  Assessment Pain Scale: 0-10 Pain Score: 8/10 Pain Type: Acute pain Pain Location: Flank Pain Orientation: Right Pain Descriptors / Indicators: Aching;Discomfort Pain Frequency: Intermittent Pain Onset: Gradual Pain Intervention(s): RN made aware, shower, repositioned Multiple Pain Sites: No   Therapy/Group: Individual Therapy  Stephanie Hahn L 05/20/2018, 6:42 AM

## 2018-05-21 DIAGNOSIS — G9340 Encephalopathy, unspecified: Secondary | ICD-10-CM

## 2018-05-21 NOTE — Progress Notes (Signed)
[place] PHYSICAL MEDICINE & REHABILITATION PROGRESS NOTE   Subjective/Complaints: No new complaints. Slept well  ROS: Patient denies fever, rash, sore throat, blurred vision, nausea, vomiting, diarrhea, cough, shortness of breath or chest pain,  headache, or mood change.   Objective:   Dg Lumbar Spine 2-3 Views  Result Date: 05/20/2018 CLINICAL DATA:  Low back pain EXAM: LUMBAR SPINE - 2-3 VIEW COMPARISON:  03/18/2018 FINDINGS: Diffuse degenerative facet disease. Mild degenerative disc disease. Normal alignment. No fracture. SI joints are symmetric and unremarkable. IMPRESSION: Degenerative disc and diffuse advanced degenerative facet disease. No acute bony abnormality. Electronically Signed   By: Charlett NoseKevin  Dover M.D.   On: 05/20/2018 08:31   No results for input(s): WBC, HGB, HCT, PLT in the last 72 hours. No results for input(s): NA, K, CL, CO2, GLUCOSE, BUN, CREATININE, CALCIUM in the last 72 hours.  Intake/Output Summary (Last 24 hours) at 05/21/2018 0841 Last data filed at 05/20/2018 1755 Gross per 24 hour  Intake 600 ml  Output -  Net 600 ml     Physical Exam: Vital Signs Blood pressure (!) 125/57, pulse 72, temperature 98.4 F (36.9 C), resp. rate 16, height 5\' 4"  (1.626 m), weight 92.3 kg, SpO2 92 %.  Constitutional: No distress . Vital signs reviewed. HEENT: EOMI, oral membranes moist Neck: supple Cardiovascular: RRR without murmur. No JVD    Respiratory: CTA Bilaterally without wheezes or rales. Normal effort    GI: BS +, non-tender, non-distended  Skin: No evidence of breakdown, no evidence of rash, no ecchymosis over flank,  Neurologic: Cranial nerves II through XII intact, motor strength is 5/5 in bilateral deltoid, bicep, tricep, grip, hip flexor, knee extensors, ankle dorsiflexor and plantar flexor  Musculoskeletal: mild tenderness Lumbar parapinal right side Back no tenderness to palpation, no pain with sup to sit   Assessment/Plan: 1. Functional  deficits secondary to HIE and bilateral cardioembolic infarcts which require 3+ hours per day of interdisciplinary therapy in a comprehensive inpatient rehab setting.  Physiatrist is providing close team supervision and 24 hour management of active medical problems listed below.  Physiatrist and rehab team continue to assess barriers to discharge/monitor patient progress toward functional and medical goals  Care Tool:  Bathing    Body parts bathed by patient: Right arm, Left arm, Chest, Abdomen, Front perineal area, Buttocks, Right upper leg, Left upper leg, Right lower leg, Left lower leg, Face         Bathing assist Assist Level: Supervision/Verbal cueing     Upper Body Dressing/Undressing Upper body dressing   What is the patient wearing?: Hospital gown only    Upper body assist Assist Level: Contact Guard/Touching assist    Lower Body Dressing/Undressing Lower body dressing      What is the patient wearing?: Underwear/pull up     Lower body assist Assist for lower body dressing: Supervision/Verbal cueing     Toileting Toileting    Toileting assist Assist for toileting: Supervision/Verbal cueing     Transfers Chair/bed transfer  Transfers assist     Chair/bed transfer assist level: Contact Guard/Touching assist     Locomotion Ambulation   Ambulation assist      Assist level: Contact Guard/Touching assist Assistive device: Other (comment)(pushing WC ) Max distance: 150   Walk 10 feet activity   Assist     Assist level: Contact Guard/Touching assist Assistive device: Other (comment)(pushing WC )   Walk 50 feet activity   Assist    Assist level: Contact Guard/Touching assist Assistive  device: Other (comment)(pushing WC )    Walk 150 feet activity   Assist    Assist level: Contact Guard/Touching assist Assistive device: Other (comment)(pushing WC )    Walk 10 feet on uneven surface  activity   Assist     Assist level:  Contact Guard/Touching assist Assistive device: Walker-rolling   Wheelchair     Assist   Type of Wheelchair: Manual    Wheelchair assist level: Contact Guard/Touching assist Max wheelchair distance: 100    Wheelchair 50 feet with 2 turns activity    Assist        Assist Level: Contact Guard/Touching assist   Wheelchair 150 feet activity     Assist Wheelchair 150 feet activity did not occur: Safety/medical concerns        Medical Problem List and Plan: 1.Decreased functional mobilitysecondary to bilateral cardioembolic infarcts  as well as  Hypoxic/ichemic encephalopathy due to potentialunintentional drug overdose   -Continue CIR therapies including PT, OT, and SLP  2. DVT Prophylaxis/Anticoagulation: Eliquis 3. Pain Management/chronic back pain:Tylenol as needed. Patient had been maintained on Neurontin and Percocet prior to admission for chronic back pain discontinued due to encephalopathy.    -continue tramadol prn, local heat                   4. Mood:Provide emotional supportas needed 5. Neuropsych: This patientisnotcapable of making decisions onherown behalf. 6. Skin/Wound Care:Routine skin checks 7. Fluids/Electrolytes/Nutrition:Routine ins and outs with follow-up chemistries. Encourage po 8. A. Fib with RVR. Cardiac rate controlled. Elevated troponin felt to be related to demand ischemia. Continue Eliquis 9. Hypertension. HCTZ 12.5 mg daily, Toprol 100 mg daily. Monitor with increased mobility Vitals:   05/20/18 2000 05/21/18 0526  BP: 132/77 (!) 125/57  Pulse: 71 72  Resp: 16 16  Temp: 98.2 F (36.8 C) 98.4 F (36.9 C)  SpO2: 91% 92%   10. Hyperthyroidism. Continue Tapazole 11. Acute kidney injury.b Baseline creatinine 1.0 12. Acute left leg pain. Resolved  X-rays and imaging revealed possible subtle nondisplaced left femoral neck fracture cannot be excluded, no complaints of LLE this am, no pain with AROM LLE 13.  Hyperlipidemia. Lipitor/Zetia  13. Chronic low back pain , pt was on  percocet at home-   Lumbar XRay reveals Degenerative disc and diffuse advanced degenerative facet disease.  -see above  LOS: 4 days A FACE TO FACE EVALUATION WAS PERFORMED  Ranelle Oyster 05/21/2018, 8:41 AM

## 2018-05-21 NOTE — Progress Notes (Signed)
Social Work Assessment and Plan  Patient Details  Name: Stephanie Hahn MRN: [id number] Date of Birth: [birth year]  Today's Date: 05/19/2018  Problem List:  Patient Active Problem List   Diagnosis Date Noted  . Anoxic encephalopathy ([place])   . Atrial fibrillation with rapid ventricular response ([place])   . Hypertensive crisis   . AKI (acute kidney injury) ([place])   . Hypokalemia   . 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 Left leg pain 05/13/2018  . Essential hypertension 05/13/2018  . CAD (coronary artery disease) 05/13/2018  . Depression 05/13/2018  . Overdose opiate, accidental or unintentional, subsequent encounter 05/12/2018  . NSTEMI (non-ST elevated myocardial infarction) ([place]) 05/12/2018  . Aspiration pneumonia ([place]) 05/12/2018  . Chronic pain 05/12/2018  . Acute encephalopathy 05/12/2018  . Seizure-like activity ([place]) 05/12/2018  . Opiate overdose ([place]) 05/12/2018   Past Medical History: History reviewed. No pertinent past medical history. Past Surgical History: No past surgical history on file. Social History:  reports that she quit smoking about 2 months ago. Her smoking use included cigarettes. She started smoking about 50 years ago. She has a 10.00 pack-year smoking history. She has never used smokeless tobacco. She reports current alcohol use of about 6.0 standard drinks of alcohol per week. She reports that she does not use drugs.  Family / Support Systems Marital Status: Divorced Patient Roles: Parent, Other (Comment)(sister) Children: Stephanie Hahn - dtr - [phone]; Stephanie Hahn - son - [phone]; Stephanie Hahn - dtr Other Supports: Stephanie Hahn - sister; brother Anticipated Caregiver: 3 children; Stephanie Hahn, Tamika, Stephanie Hahn); all three plan to split shifts Ability/Limitations of Caregiver: Her grown children can provide Min/Mod A; brother can do supervision Caregiver Availability: 24/7 Family Dynamics: supportive family  Social History Preferred  language: English Religion: None Read: Yes Write: Yes Employment Status: Retired(Pt was a Surveyor, mining) Date Retired/Disabled/Unemployed: 2012 Age Retired: 86 Public relations account executive Issues: none reported Guardian/Conservator: MD has determined that pt is not capable of making her own decisions currently. Children would be next of kin for decision making.   Abuse/Neglect Abuse/Neglect Assessment Can Be Completed: Yes Physical Abuse: Denies Verbal Abuse: Denies Sexual Abuse: Denies Exploitation of patient/patient's resources: Denies Self-Neglect: Denies  Emotional Status Pt's affect, behavior and adjustment status: Pt was flat and focused on getting home. Recent Psychosocial Issues: none reported Psychiatric History: none reported Substance Abuse History: none reported  Patient / Family Perceptions, Expectations & Goals Pt/Family understanding of illness & functional limitations: Pt said she was told by the doctors that she had a stroke, but pt did not elaborate on her deficits/limitations. Premorbid pt/family roles/activities: Pt watches TV.  She does not go out of the house much. Anticipated changes in roles/activities/participation: Pt plans to resume activities when she goes home. Pt/family expectations/goals: Pt wants to get home.  Community Resources Express Scripts: None Premorbid Home Care/DME Agencies: None Transportation available at discharge: family Resource referrals recommended: Neuropsychology, Support group (specify)(stroke support group)  Discharge Planning Living Arrangements: Other relatives(Pt's brother lives with pt.) Support Systems: Children, Immunologist, Water engineer, Other relatives Type of Residence: Private residence Insurance Resources: [employer], Multimedia programmer (specify)(Medicare Part A only and Engineer, civil (consulting) [employer]) Museum/gallery curator Resources: Radio broadcast assistant Screen Referred: No Money Management:  Patient Does the patient have any problems obtaining your medications?: No Home Management: Pt and brother share these responsibilities. Patient/Family Preliminary Plans: Pt plans to return to her home with multiple family members taking turns to be with her. Social Work Anticipated Follow Up  Needs: HH/OP, Support Group Expected length of stay: 5 to 7 days  Clinical Impression CSW met with pt to introduce self and role of CSW, as well at to complete assessment.  Pt was not very talkative with CSW, except to say that she was ready to go home.  Pt has 3 dogs at home and is looking forward to being back there.  CSW explained that pt's therapy team still has things they want to accomplish with pt and the harder she works, the quicker she will be ready to go home, especially since pt was not wanting to participate in therapy earlier that day.  Pt gave CSW permission to call her son since he is coordinating pt's care at home.  CSW attempted to call son, but call did not go through.  CSW will continue to try to contact him, especially since pt will likely have a short length of stay and plan will need to be confirmed.  No current needs/concerns/questions, but CSW will continue to follow and assist as needed.  Stephanie Hahn, Silvestre Mesi 05/21/2018, 2:25 AM

## 2018-05-22 ENCOUNTER — Inpatient Hospital Stay (HOSPITAL_COMMUNITY): Payer: Federal, State, Local not specified - PPO | Admitting: Physical Therapy

## 2018-05-22 ENCOUNTER — Inpatient Hospital Stay (HOSPITAL_COMMUNITY): Payer: Federal, State, Local not specified - PPO | Admitting: Occupational Therapy

## 2018-05-22 NOTE — Progress Notes (Signed)
Physical Therapy Session Note  Patient Details  Name: Stephanie Hahn MRN: [id number] Date of Birth: October 02, 1945  Today's Date: 05/22/2018 PT Individual Time: 1415-1430 PT Individual Time Calculation (min): 15 min   Short Term Goals: Week 1:  PT Short Term Goal 1 (Week 1): short term goals = long term goals due to ELOS   Skilled Therapeutic Interventions/Progress Updates:  Pt was seen bedside in the pm. Pt just finished working with OT. Pt transferred supine to edge of bed with S. Pt transferred edge of bed to standing with S. Pt ambulated 50 feet without assistive device and S to c/g with wide base of support. Pt stood back up and pushed w/c back to room with S to c/g. Pt sat on edge of bed. Pt transferred edge of bed to supine with S. Pt stated she was fatigued and had already done enough walking and standing today. Pt unwilling to participate with further therapy even with encouragement. Pt left sitting up in bed with call bell within reach and bed alarm on.   Therapy Documentation Precautions:  Precautions Precautions: Fall Restrictions Weight Bearing Restrictions: No General: PT Amount of Missed Time (min): 45 Minutes PT Missed Treatment Reason: Patient fatigue Vital Signs:   Pain: No c/o pain.    Therapy/Group: Individual Therapy  Rayford HalstedMitchell, [name] 05/22/2018, 2:35 PM

## 2018-05-22 NOTE — Progress Notes (Signed)
Physical Therapy Note  Patient Details  Name: Gerre PebblesSara Segel MRN: [id number] Date of Birth: [birth year] Today's Date: 05/22/2018    Attempted to see pt in the am. Upon entering introduced self to patient. Pt initially made eye contact with therapist. Pt c/o pain 6/10 in R side. Pt was medicated prior to treatment. Pt stated, "I just don't feel like it right now, I have headache." Attempted to encourage participation with therapy. Pt continued to defer treatment and made minimal eye contact with therapist. Pt educated on PT plan of care and increasing strength to go home. Pt continued to defer treatment. Pt stating, "I just don't feel like it right now". Treatment ended. Notified pt's nurse of pt's unwillingness to participate with therapy at this time.    Rayford HalstedMitchell, Thadeus Gandolfi G 05/22/2018, 8:16 AM

## 2018-05-22 NOTE — Progress Notes (Signed)
Occupational Therapy Session Note  Patient Details  Name: Stephanie Hahn MRN: [id number] Date of Birth: 29-Jul-1945  Today's Date: 05/22/2018 OT Individual Time: [id number] OT Individual Time Calculation (min): 72 min   Short Term Goals: Week 1:  OT Short Term Goal 1 (Week 1): STGs=LTGS secondary to estimated short LOS  Skilled Therapeutic Interventions/Progress Updates:    Pt greeted in bed and amenable to tx with encouragement. Pt completed bathing, dressing, oral care/grooming tasks, and bedmaking during session. All functional transfers completed with supervision-steady assist at ambulatory level without device. Started with toileting, with pt completing tasks with close supervision while managing her hospital gown. Pt then showered while standing 90% of the time with close supervision. She sat on padded tub bench to wash feet with instruction. Pt also required safety cuing to turn off water and dry off before exiting shower. Pt proceeded to dress sit<stand from chair in bathroom (with paper scrubs). Pt required close supervision for dynamic sitting and standing balance. Afterwards she ambulated with steady assist to sink and completed oral care in standing. Once finished, she took seated rest break EOB. Discussed her hobbies (gardening, playing with small dogs), family, and former career [employer] [place] Afterwards she was agreeable to strip and make bed with clean linen. She ambulated to linen cart and required  vcs for selecting necessary items. Pt required cues for sequencing, awareness (I.e. doffing soiled pillowcase prior to donning new one), and problem solving while ambulating around bed to meet task demands. She required several seated rest breaks due to decreased standing endurance. At end of session she returned to bed and was left with all needs within reach and bed alarm set.   Therapy Documentation Precautions:  Precautions Precautions: Fall Restrictions Weight Bearing  Restrictions: No Vital Signs: Therapy Vitals Temp: 98.7 F (37.1 C) Pulse Rate: 68 Resp: 17 BP: 117/65 Patient Position (if appropriate): Lying Oxygen Therapy SpO2: 99 % O2 Device: Room Air Pain: No s/s pain during tx  Pain Assessment Pain Scale: 0-10 Pain Score: 8  Pain Type: Acute pain Pain Location: Flank Pain Orientation: Right Pain Descriptors / Indicators: Aching Pain Frequency: Occasional Pain Onset: On-going Pain Intervention(s): Medication (See eMAR) ADL:       Therapy/Group: Individual Therapy  Davionna Blacksher A Saw Mendenhall 05/22/2018, 4:16 PM

## 2018-05-22 NOTE — Progress Notes (Signed)
[place] PHYSICAL MEDICINE & REHABILITATION PROGRESS NOTE   Subjective/Complaints: No new complaints. Asked about going home tomorrow and how that would all work. I told her I wasn't aware she was going home tomorrow  ROS: Patient denies fever, rash, sore throat, blurred vision, nausea, vomiting, diarrhea, cough, shortness of breath or chest pain, joint or back pain, headache, or mood change.   Objective:   No results found. No results for input(s): WBC, HGB, HCT, PLT in the last 72 hours. No results for input(s): NA, K, CL, CO2, GLUCOSE, BUN, CREATININE, CALCIUM in the last 72 hours.  Intake/Output Summary (Last 24 hours) at 05/22/2018 1519 Last data filed at 05/21/2018 1900 Gross per 24 hour  Intake 240 ml  Output -  Net 240 ml     Physical Exam: Vital Signs Blood pressure 117/65, pulse 68, temperature 98.7 F (37.1 C), resp. rate 17, height 5\' 4"  (1.626 m), weight 91.1 kg, SpO2 99 %.  Constitutional: No distress . Vital signs reviewed. HEENT: EOMI, oral membranes moist Neck: supple Cardiovascular: RRR without murmur. No JVD    Respiratory: CTA Bilaterally without wheezes or rales. Normal effort    GI: BS +, non-tender, non-distended  Skin: No evidence of breakdown, no evidence of rash, no ecchymosis over flank,  Neurologic: Cranial nerves II through XII intact, motor strength is 5/5 in bilateral deltoid, bicep, tricep, grip, hip flexor, knee extensors, ankle dorsiflexor and plantar flexor  Musculoskeletal:   Back without tenderness to palpation, no pain with sup to sit   Assessment/Plan: 1. 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  Physiatrist is providing close team supervision and 24 hour management of active medical problems listed below.  Physiatrist and rehab team continue to assess barriers to discharge/monitor patient  progress toward functional and medical goals  Care Tool:  Bathing    Body parts bathed by patient: Right arm, Left arm, Chest, Abdomen, Front perineal area, Buttocks, Right upper leg, Left upper leg, Right lower leg, Left lower leg, Face         Bathing assist Assist Level: Supervision/Verbal cueing     Upper Body Dressing/Undressing Upper body dressing   What is the patient wearing?: Hospital gown only    Upper body assist Assist Level: Contact Guard/Touching assist    Lower Body Dressing/Undressing Lower body dressing      What is the patient wearing?: Underwear/pull up     Lower body assist Assist for lower body dressing: Supervision/Verbal cueing     Toileting Toileting    Toileting assist Assist for toileting: Supervision/Verbal cueing     Transfers Chair/bed transfer  Transfers assist     Chair/bed transfer assist level: Contact Guard/Touching assist     Locomotion Ambulation   Ambulation assist      Assist level: Contact Guard/Touching assist Assistive device: Other (comment)(pushing WC ) Max distance: 50   Walk 10 feet activity   Assist     Assist level: Contact Guard/Touching assist Assistive device: Other (comment)(pushing WC )   Walk 50 feet activity   Assist    Assist level: Contact Guard/Touching assist Assistive device: Other (comment)(pushing WC )    Walk 150 feet activity   Assist    Assist level: Contact Guard/Touching assist Assistive device: Other (comment)(pushing WC )    Walk 10 feet on uneven surface  activity   Assist     Assist level: Contact Guard/Touching  assist Assistive device: Photographer   Type of Wheelchair: Chief of Staff assist level: Contact Guard/Touching assist Max wheelchair distance: 100    Wheelchair 50 feet with 2 turns activity    Assist        Assist Level: Contact Guard/Touching assist   Wheelchair 150 feet activity      Assist Wheelchair 150 feet activity did not occur: Safety/medical concerns        Medical Problem List and Plan: 1.Decreased functional mobilitysecondary to bilateral cardioembolic infarcts  as well as  Hypoxic/ichemic encephalopathy due to potentialunintentional drug overdose   -Continue CIR therapies including PT, OT, and SLP   -told her that primary team would speak with her about LOS on Monday 2. DVT Prophylaxis/Anticoagulation: Eliquis 3. Pain Management/chronic back pain:Tylenol as needed. Patient had been maintained on Neurontin and Percocet prior to admission for chronic back pain discontinued due to encephalopathy.    -continue tramadol prn, local heat                   4. Mood:Provide emotional supportas needed 5. Neuropsych: This patientisnotcapable of making decisions onherown behalf. 6. Skin/Wound Care:Routine skin checks 7. Fluids/Electrolytes/Nutrition:Routine ins and outs with follow-up chemistries. Encourage po 8. A. Fib with RVR. Cardiac rate controlled. Elevated troponin felt to be related to demand ischemia. Continue Eliquis 9. Hypertension. HCTZ 12.5 mg daily, Toprol 100 mg daily. Monitor with increased mobility Vitals:   05/22/18 0559 05/22/18 1454  BP: 137/70 117/65  Pulse: 67 68  Resp: 18 17  Temp: 98.6 F (37 C) 98.7 F (37.1 C)  SpO2: 100% 99%   -controlled 12/15 10. Hyperthyroidism. Continue Tapazole 11. Acute kidney injury.b Baseline creatinine 1.0 12. Acute left leg pain. Resolved  X-rays and imaging revealed possible subtle nondisplaced left femoral neck fracture cannot be excluded, no complaints of LLE this am, no pain with AROM LLE 13. Hyperlipidemia. Lipitor/Zetia  13. Chronic low back pain , pt was on  percocet at home-   Lumbar XRay reveals Degenerative disc and diffuse advanced degenerative facet disease.  -pain appears relatively controlled at present.   LOS: 5 days A FACE TO FACE EVALUATION WAS  PERFORMED  Ranelle Oyster 05/22/2018, 3:19 PM

## 2018-05-23 ENCOUNTER — Inpatient Hospital Stay (HOSPITAL_COMMUNITY): Payer: Federal, State, Local not specified - PPO | Admitting: Occupational Therapy

## 2018-05-23 ENCOUNTER — Inpatient Hospital Stay (HOSPITAL_COMMUNITY): Payer: Federal, State, Local not specified - PPO | Admitting: Speech Pathology

## 2018-05-23 ENCOUNTER — Inpatient Hospital Stay (HOSPITAL_COMMUNITY): Payer: Federal, State, Local not specified - PPO | Admitting: Physical Therapy

## 2018-05-23 ENCOUNTER — Encounter (HOSPITAL_COMMUNITY): Payer: Federal, State, Local not specified - PPO | Admitting: Psychology

## 2018-05-23 DIAGNOSIS — M545 Low back pain: Secondary | ICD-10-CM

## 2018-05-23 DIAGNOSIS — G8929 Other chronic pain: Secondary | ICD-10-CM

## 2018-05-23 DIAGNOSIS — G894 Chronic pain syndrome: Secondary | ICD-10-CM

## 2018-05-23 DIAGNOSIS — N183 Chronic kidney disease, stage 3 unspecified: Secondary | ICD-10-CM

## 2018-05-23 DIAGNOSIS — I480 Paroxysmal atrial fibrillation: Secondary | ICD-10-CM

## 2018-05-23 NOTE — Progress Notes (Signed)
Occupational Therapy Session Note  Patient Details  Name: Stephanie Hahn MRN: [id number] Date of Birth: 1945-07-05  Today's Date: 05/23/2018 OT Individual Time: 0730-0800 OT Individual Time Calculation (min): 30 min    Short Term Goals: Week 1:  OT Short Term Goal 1 (Week 1): STGs=LTGS secondary to estimated short LOS  Skilled Therapeutic Interventions/Progress Updates:    Patient in bed upon arrival.  She presents with flat affect, poor eye contact, one word responses to questions.  She completed her meal mod I, bed mobility mod I, unsupported sitting at edge of bed DS, Ambulation on unit S with RW.  She declined bathing / dressing tasks today.  Patient returned to bed with alarm set at end of session.   Therapy Documentation Precautions:  Precautions Precautions: Fall Restrictions Weight Bearing Restrictions: No General:   Vital Signs:  Pain: Pain Assessment Pain Scale: 0-10 Pain Score: 0-No pain   Therapy/Group: Individual Therapy  Barrie LymeStacey A Neeta Storey 05/23/2018, 9:24 AM

## 2018-05-23 NOTE — Progress Notes (Signed)
Occupational Therapy Session Note  Patient Details  Name: Stephanie Hahn MRN: [id number] Date of Birth: 26-Oct-1945  Today's Date: 05/23/2018 OT Individual Time: 1100-1135 OT Individual Time Calculation (min): 35 min    Short Term Goals: Week 1:  OT Short Term Goal 1 (Week 1): STGs=LTGS secondary to estimated short LOS  Skilled Therapeutic Interventions/Progress Updates:    Pt seen for OT ADL bathing/dressing session. Pt in supine upon arrival, initially refusing therapy, however, with lots of encouragement, education and deal to end session once ADLs complete pt agreeable to tx session and denying pain. She ambulated throughout room with supervision, no AD, steadying self on furniture throughout room. Pt reports she "will see how it goes" regarding use of AD in the house at d/c. She turned water on to heat up in shower, then gathered items required for showering task. Pt then entered shower with water running and all clothes still donned. Pt not listening to therapist cues, ultimately requiring max cuing and therapist turning off water for pt to correctly sequence bathing task.  With cuing for safety awareness and sequencing, pt returned to toilet to dress, donning hospital gown and socks. Pt returned to EOB, refusing any additional therapy at this time. Pt left seated EOB, bed alarm on, all needs in reach and NT aware of pt's position. CSW arrived during session to discuss DME in prep for d/c home. Pt refusing purchase of tub transfer bench despite therapist's recommendation. Pt refusing to go to ADL apartment to complete simulated tub/shower transfer without use of bench, again responding "I did it before, I know how to do it, I can do it". Education provided regarding deficits related to CVA and how things are different now than before, pt cont to refuse. CSW aware of events.   Therapy Documentation Precautions:  Precautions Precautions: Fall Restrictions Weight Bearing Restrictions:  No Pain: Pain Assessment Pain Scale: 0-10 Pain Score:No/denies pain   Therapy/Group: Individual Therapy  [name] L 05/23/2018, 7:17 AM

## 2018-05-23 NOTE — Progress Notes (Signed)
Speech Language Pathology Discharge Summary  Patient Details  Name: Stephanie Hahn MRN: [id number] Date of Birth: 02-01-1946  Today's Date: 05/23/2018 SLP Individual Time: 43-1515 SLP Individual Time Calculation (min): 30 min   Skilled Therapeutic Interventions:  Skilled treatment session focused on cognition goals. SLP facilitated session with education on current deficits and need for therapy. Pt is largely not responsive with any acknowledgement.     Patient has met 4 of 4 long term goals.  Patient to discharge at [facility] level.    Clinical Impression/Discharge Summary:   Pt has been resistant to therapy largely in part to decreased awareness of deficits. Education as been given but pt is discharging home with help of family. Recommend HHST to follow-up with hopes that this will ensure increased safety.   Care Partner:  Caregiver Able to Provide Assistance: Yes  Type of Caregiver Assistance: Cognitive  Recommendation:  Home Health SLP;24 hour supervision/assistance  Rationale for SLP Follow Up: Maximize cognitive function and independence;Reduce caregiver burden    Reasons for discharge: Discharged from hospital   Patient/Family Agrees with Progress Made and Goals Achieved: Yes    Demari Gales 05/23/2018, 4:35 PM

## 2018-05-23 NOTE — Progress Notes (Signed)
[place] PHYSICAL MEDICINE & REHABILITATION PROGRESS NOTE   Subjective/Complaints: Patient seen lying in bed this morning.  She states she slept well overnight.  She is frustrated and wants to go home.  ROS: Denies CP, SOB, N/V/D  Objective:   No results found. No results for input(s): WBC, HGB, HCT, PLT in the last 72 hours. No results for input(s): NA, K, CL, CO2, GLUCOSE, BUN, CREATININE, CALCIUM in the last 72 hours.  Intake/Output Summary (Last 24 hours) at 05/23/2018 0903 Last data filed at 05/23/2018 0823 Gross per 24 hour  Intake 504 ml  Output -  Net 504 ml     Physical Exam: Vital Signs Blood pressure (!) 143/79, pulse 67, temperature 98.2 F (36.8 C), temperature source Oral, resp. rate 18, height 5\' 4"  (1.626 m), weight 98.6 kg, SpO2 99 %.  Constitutional: No distress . Vital signs reviewed. HENT: Normocephalic.  Atraumatic. Eyes: EOMI. No discharge. Cardiovascular: RRR. No JVD. Respiratory: CTA Bilaterally. Normal effort. GI: BS +. Non-distended. Musc: No edema or tenderness in extremities. Neurologic: Alert and oriented. Motor: 5/5 grossly throughout Skin: Warm and dry.  Intact.  Assessment/Plan: 1. 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  Physiatrist is providing close team supervision and 24 hour management of active medical problems listed below.  Physiatrist and rehab team continue to assess barriers to discharge/monitor patient progress toward functional and medical goals  Care Tool:  Bathing    Body parts bathed by patient: Right arm, Left arm, Chest, Abdomen, Front perineal area, Buttocks, Right upper leg, Left upper leg, Right lower leg, Left lower leg, Face         Bathing assist Assist Level: Supervision/Verbal cueing     Upper Body Dressing/Undressing Upper body dressing   What is the patient  wearing?: Pull over shirt    Upper body assist Assist Level: Set up assist    Lower Body Dressing/Undressing Lower body dressing      What is the patient wearing?: Underwear/pull up, Pants     Lower body assist Assist for lower body dressing: Supervision/Verbal cueing     Toileting Toileting    Toileting assist Assist for toileting: Supervision/Verbal cueing     Transfers Chair/bed transfer  Transfers assist     Chair/bed transfer assist level: Contact Guard/Touching assist     Locomotion Ambulation   Ambulation assist      Assist level: Contact Guard/Touching assist Assistive device: Other (comment)(pushing WC ) Max distance: 50   Walk 10 feet activity   Assist     Assist level: Contact Guard/Touching assist Assistive device: Other (comment)(pushing WC )   Walk 50 feet activity   Assist    Assist level: Contact Guard/Touching assist Assistive device: Other (comment)(pushing WC )    Walk 150 feet activity   Assist    Assist level: Contact Guard/Touching assist Assistive device: Other (comment)(pushing WC )    Walk 10 feet on uneven surface  activity   Assist     Assist level: Contact Guard/Touching assist Assistive device: Walker-rolling   Wheelchair     Assist   Type of Wheelchair: Manual    Wheelchair assist level: Contact Guard/Touching assist Max wheelchair distance: 100    Wheelchair 50 feet with 2 turns activity    Assist        Assist Level: Contact Guard/Touching assist   Wheelchair 150 feet activity     Assist  Wheelchair 150 feet activity did not occur: Safety/medical concerns        Medical Problem List and Plan: 1.Decreased functional mobilitysecondary to bilateral cardioembolic infarcts  as well as  Hypoxic/ichemic encephalopathy due to potentialunintentional drug overdose   -Continue CIR  Notes reviewed - hypoxic encephalopathy due drug overdose, CT head reviewed-unremarkable for  acute intracranial process, labs reviewed  Discussed with team regarding patient's request to go home.  Likely DC tomorrow.  Will see patient for transitional care management in 1-2 weeks post-discharge 2. DVT Prophylaxis/Anticoagulation: Eliquis 3. Pain Management/chronic back pain:Tylenol as needed. Patient had been maintained on Neurontin and Percocet prior to admission for chronic back pain discontinued due to encephalopathy.    -continue tramadol prn, local heat                   4. Mood:Provide emotional supportas needed 5. Neuropsych: This patientis?capable of making decisions onherown behalf. 6. Skin/Wound Care:Routine skin checks 7. Fluids/Electrolytes/Nutrition:Routine ins and outs 8. A. Fib with RVR. Cardiac rate controlled. Elevated troponin felt to be related to demand ischemia. Continue Eliquis 9. Hypertension. HCTZ 12.5 mg daily, Toprol 100 mg daily. Monitor with increased mobility Vitals:   05/22/18 1938 05/23/18 0419  BP: 130/67 (!) 143/79  Pulse: 70 67  Resp: 16 18  Temp: 99.2 F (37.3 C) 98.2 F (36.8 C)  SpO2: (!) 87% 99%   Relatively controlled on 12/16 10. Hyperthyroidism. Continue Tapazole 11.  Suspected CKD: Creatinine 1.24 on 12/11 12. Acute left leg pain. Resolved  X-rays and imaging revealed possible subtle nondisplaced left femoral neck fracture cannot be excluded 13. Hyperlipidemia. Lipitor/Zetia  13. Chronic low back pain , pt was on  percocet at home-   Lumbar XRay reveals Degenerative disc and diffuse advanced degenerative facet disease.  -pain appears relatively controlled at present.   LOS:6 days A FACE TO FACE EVALUATION WAS PERFORMED  Gloria Lambertson Karis Jubanil Shelby Anderle 05/23/2018, 9:03 AM

## 2018-05-23 NOTE — Plan of Care (Signed)
  Problem: Consults Goal: RH GENERAL PATIENT EDUCATION Description See Patient Education module for education specifics. Outcome: Completed/Met Goal: Skin Care Protocol Initiated - if Braden Score 18 or less Description If consults are not indicated, leave blank or document N/A Outcome: Completed/Met   Problem: RH BOWEL ELIMINATION Goal: RH STG MANAGE BOWEL WITH ASSISTANCE Description STG Manage Bowel with min Assistance.  Outcome: Completed/Met Goal: RH STG MANAGE BOWEL W/MEDICATION W/ASSISTANCE Description STG Manage Bowel with Medication with min Assistance.  Outcome: Completed/Met   Problem: RH BLADDER ELIMINATION Goal: RH STG MANAGE BLADDER WITH ASSISTANCE Description STG Manage Bladder With min Assistance  Outcome: Completed/Met   Problem: RH SKIN INTEGRITY Goal: RH STG SKIN FREE OF INFECTION/BREAKDOWN Description No skin infection entire stay on rehab  Outcome: Completed/Met Goal: RH STG MAINTAIN SKIN INTEGRITY WITH ASSISTANCE Description STG Maintain Skin Integrity With min Assistance.  Outcome: Completed/Met   Problem: RH SAFETY Goal: RH STG ADHERE TO SAFETY PRECAUTIONS W/ASSISTANCE/DEVICE Description STG Adhere to Safety Precautions With min Assistance/Device.  Outcome: Completed/Met Goal: RH STG DECREASED RISK OF FALL WITH ASSISTANCE Description STG Decreased Risk of Fall With min Assistance.  Outcome: Completed/Met   Problem: RH PAIN MANAGEMENT Goal: RH STG PAIN MANAGED AT OR BELOW PT'S PAIN GOAL Description Pain less than 2  Outcome: Completed/Met   Problem: RH KNOWLEDGE DEFICIT GENERAL Goal: RH STG INCREASE KNOWLEDGE OF SELF CARE AFTER HOSPITALIZATION Outcome: Completed/Met

## 2018-05-23 NOTE — Consult Note (Signed)
Neuropsychological Consultation   Patient:   Stephanie Hahn   DOB:   April 22, 1946  MR Number:  [id number]  Location:  [facility] [address] CENTER [id number] [place] [address] Dept: [phone] Loc: [phone]           Date of Service:   05/23/2018  Start Time:   9:30 AM End Time:   10:30 AM  Provider/Observer:  Stephanie Phenix, Psy.D.       Clinical Neuropsychologist       Billing Code/Service: [phone] 4 Units  Chief Complaint:    Stephanie Hahn is a 72 year old female with history of chronic back pain maintained on Neurontin and percocet, CAD, hypertension and hyperlipidemia.  Presented to [facility] after being found down poorly responsive and hypoxic.  EMS reproted O2 sats were in the 50's with poor respiratory effort.  She responded to Narcan.  Patient transferred to [facility] for further evaluation.  UDS positive for opiates and benzos.  MRI suggested restricted diffusion in both cerebral hemispheres suggesting acute embolic infarctions.  Patient referred for CIR program.    Reason for Service:  Stephanie Hahn is a 84 year old right-handed female with history of chronic back pain maintained on Neurontin and Percocet, CAD, hypertension and hyperlipidemia. Per chart review and patient, patient lives alone independent prior to admission. One level home with 2 steps to entry. She does have family in the area that can assist. Presented to [facility] after being found down poorly responsive and hypoxic. Per EMS report saturations in the 50s with poor respiratory effort. She did respond to Narcan. Patient noted to be in atrial fibrillation with RVR placed on IV heparin. She was transferred to [facility] for further evaluation. UDS positive for opiates and benzos. Chemistry panel creatinine 2.40 from baseline 1.0, CK 240, WBC 17,000, lactic acid reported to be normal, troponin 1.78-3.7.  Cranial CT scan reviewed, unremarkable for acute intracranial process. Chest x-ray showed no focal infiltrate. Unilateral pelvic films question very subtle nondisplaced left femoral neck fracture cannot be excluded. excluded no evidence of dislocation area follow-up MRI of bilateral globus pallidus restricted diffusion reflecting possible acute injury from hypoxia, metabolic disturbance. Additional scattered punctate foci of restricted diffusion in both cerebral hemispheres suggesting acute embolic infarctions. Carotid Dopplers with 40-59% left ICA stenosis. EEG indicated of severe encephalopathy. Elevated troponin felt to be related to demand ischemia. Echocardiogram with ejection fraction of 65% grade 2 diastolic dysfunction. Neurology follow-up maintained on Eliquis for both CVA prophylaxis and atrial fibrillation. Tolerating a regular diet. Therapy evaluations completed with recommendations of physical medicine rehabilitation consult. Patient was admitted for a comprehensive rehabilitation program.  Current Status:  Patient was very restricted today and avoided interacting beyond one or two words and really only wanted to know when she was going home and would all of her medications be ready for her upon discharge.  She denied any changes in cognitive function, although it was very hard to assess her with her resistance to answering any questions in detail.    Behavioral Observation: Stephanie Hahn  presents as a 53 y.o.-year-old Right African American Female who appeared her stated age. her dress was Appropriate and she was Well Groomed and her manners were Appropriate, inappropriate to the situation.  her participation was indicative of Resistant behaviors.  There were any physical disabilities noted.  she displayed an inappropriate level of cooperation and motivation.     Interactions:  Minimal Resistant  Attention:   abnormal and attention span appeared shorter than expected for age  Memory:   not  examined;   Visuo-spatial:  not examined  Speech (Volume):  low  Speech:   normal;   Thought Process:  Coherent  Though Content:  Rumination; about discharge and medications   Orientation:   person, place, time/date and situation  Judgment:   Poor  Planning:   Poor  Affect:    Flat and Irritable  Mood:    Irritable  Insight:   Shallow  Intelligence:   low  Substance Use:  There are suspicions of prescription drug abuse reported by medical records.    Family Med/Psych History:  Family History  Problem Relation Age of Onset  . Alzheimer's disease Other   . Alzheimer's disease Other     Risk of Suicide/Violence: moderate While the patient denies SI or HI, she has been found unresponsive with opiates and benzos in system.  Impression/DX:  Stephanie Hahn is a 72 year old female with history of chronic back pain maintained on Neurontin and percocet, CAD, hypertension and hyperlipidemia.  Presented to [facility] after being found down poorly responsive and hypoxic.  EMS reproted O2 sats were in the 50's with poor respiratory effort.  She responded to Narcan.  Patient [facility] evaluation.  UDS positive for opiates and benzos.  MRI suggested restricted diffusion in both cerebral hemispheres suggesting acute embolic infarctions.  Patient referred for CIR program.    Patient was very restricted today and avoided interacting beyond one or two words and really only wanted to know when she was going home and would all of her medications be ready for her upon discharge.  She denied any changes in cognitive function, although it was very hard to assess her with her resistance to answering any questions in detail.    Diagnosis:    Chronic low back pain - Plan: DG Lumbar Spine 2-3 Views, DG Lumbar Spine 2-3 Views         Electronically Signed   _______________________ Stephanie Hahn, Psy.D.

## 2018-05-23 NOTE — Discharge Instructions (Signed)
Inpatient Rehab Discharge Instructions  Stephanie PebblesSara Seguin Discharge date and time: No discharge date for patient encounter.   Activities/Precautions/ Functional Status: Activity: activity as tolerated Diet: regular diet Wound Care: none needed Functional status:  ___ No restrictions     ___ Walk up steps independently ___ 24/7 supervision/assistance   ___ Walk up steps with assistance ___ Intermittent supervision/assistance  ___ Bathe/dress independently ___ Walk with walker     _x__ Bathe/dress with assistance ___ Walk Independently    ___ Shower independently ___ Walk with assistance    ___ Shower with assistance ___ No alcohol     ___ Return to work/school ________  COMMUNITY REFERRALS UPON DISCHARGE:   Home Health:   PT     OT  [facility]  Phone:  [phone] Medical Equipment/Items Ordered:  Rolling walker  Agency/Supplier:  [facility]       Phone:  [phone]  Special Instructions: No driving   My questions have been answered and I understand these instructions. I will adhere to these goals and the provided educational materials after my discharge from the hospital.  Patient/Caregiver Signature _______________________________ Date __________  Clinician Signature _______________________________________ Date __________  Please bring this form and your medication list with you to all your follow-up doctor's appointments.   Information on my medicine - ELIQUIS (apixaban)  This medication education was reviewed with me or my healthcare representative as part of my discharge preparation.  The pharmacist that spoke with me during my hospital stay was:  Ulyses SouthwardMinh Pham, RPH-CPP  Why was Eliquis prescribed for you? Eliquis was prescribed for you to reduce the risk of a blood clot forming that can cause a stroke if you have a medical condition called atrial fibrillation (a type of irregular heartbeat).  What do You need to know about Eliquis ? Take your  Eliquis TWICE DAILY - one tablet in the morning and one tablet in the evening with or without food. If you have difficulty swallowing the tablet whole please discuss with your pharmacist how to take the medication safely.  Take Eliquis exactly as prescribed by your doctor and DO NOT stop taking Eliquis without talking to the doctor who prescribed the medication.  Stopping may increase your risk of developing a stroke.  Refill your prescription before you run out.  After discharge, you should have regular check-up appointments with your healthcare provider that is prescribing your Eliquis.  In the future your dose may need to be changed if your kidney function or weight changes by a significant amount or as you get older.  What do you do if you miss a dose? If you miss a dose, take it as soon as you remember on the same day and resume taking twice daily.  Do not take more than one dose of ELIQUIS at the same time to make up a missed dose.  Important Safety Information A possible side effect of Eliquis is bleeding. You should call your healthcare provider right away if you experience any of the following: ? Bleeding from an injury or your nose that does not stop. ? Unusual colored urine (red or dark brown) or unusual colored stools (red or black). ? Unusual bruising for unknown reasons. ? A serious fall or if you hit your head (even if there is no bleeding).  Some medicines may interact with Eliquis and might increase your risk of bleeding or clotting while on Eliquis. To help avoid this, consult your healthcare provider or pharmacist  prior to using any new prescription or non-prescription medications, including herbals, vitamins, non-steroidal anti-inflammatory drugs (NSAIDs) and supplements.  This website has more information on Eliquis (apixaban): http://www.eliquis.com/eliquis/home

## 2018-05-23 NOTE — Progress Notes (Signed)
Physical Therapy Note  Patient Details  Name: Stephanie Hahn MRN: [id number] Date of Birth: 07/12/1945 Today's Date: 05/23/2018    Attempted to see patient for scheduled therapy session. Pt declines to participate in any OOB mobility due to onset of chronic back pain with gait. Encouraged pt to perform mobility once she d/c home to prevent decline in function. Pt declines any bed-level therex due to fatigue. Pt left seated in bed with needs in reach, missed 30 min of scheduled skilled therapy services due to unwillingness to participate.   Peter Congoaylor  Lelynd Poer 05/23/2018, 4:15 PM

## 2018-05-23 NOTE — Discharge Summary (Signed)
Discharge summary job # [id number]

## 2018-05-23 NOTE — Progress Notes (Signed)
Occupational Therapy Discharge Summary  Patient Details  Name: Stephanie Hahn MRN: [id number] Date of Birth: [birth year]   Patient has met 12 of 14 long term goals due to improved activity tolerance, improved balance, postural control, ability to compensate for deficits, improved attention, improved awareness and improved coordination.  Patient to discharge at overall Supervision level. Pt with very limited participate during rehab admission, pt refusing multiple sessions as well as missing scheduled therapy time 2/2 refusal without medical reason.   Patient reports that she lives with brother who will be able to provide supervision assist at d/c as well as assisting with IADLs. No family/caregiivers present during rehab admission to confirm assist at home or to verify PLOF. Pt requires supervision for safety 2/2 cognitive impairments including very poor safety awareness and attention. She requires VCs throughout basic ADL tasks for safety and sequencing as a result.  OT recommending pt use tub transfer bench at d/c, however, pt declining purchase of bench. Pt also refused to practice simulated tub/shower transfer without use of bench during rehab admission.   Reasons goals not met: Pt did not meet cognitive goals of overall min A. She requires overall mod A for intellectual awareness and selective attention  Recommendation:  Patient will benefit from ongoing skilled OT services in home health setting to continue to advance functional skills in the area of BADL, iADL and Reduce care partner burden.  Equipment: Pt reports she has BSC. She declined purchase of tub transfer bench despite OT recommendation.   Reasons for discharge: treatment goals met, discharge from hospital and Limited therapy participation during rehab admission  Patient/family agrees with progress made and goals achieved: Yes  OT Discharge Precautions/Restrictions  Precautions Precautions: Fall Restrictions Weight Bearing  Restrictions: No Vision Baseline Vision/History: No visual deficits Wears Glasses: Reading only Patient Visual Report: No change from baseline Perception  Perception: Within Functional Limits Praxis Praxis: Intact Cognition Overall Cognitive Status: Impaired/Different from baseline Arousal/Alertness: Awake/alert Orientation Level: Oriented X4 Attention: Selective Focused Attention: Impaired Focused Attention Impairment: Verbal basic;Functional basic Selective Attention: Impaired Awareness: Impaired Awareness Impairment: Intellectual impairment Problem Solving: Impaired Problem Solving Impairment: Functional basic;Verbal basic Safety/Judgment: Impaired Comments: Impulsive, poor safety awareness and awareness of deficits Sensation Sensation Light Touch: Appears Intact Coordination Gross Motor Movements are Fluid and Coordinated: Yes Fine Motor Movements are Fluid and Coordinated: Yes Motor  Motor Motor: Within Functional Limits Motor - Skilled Clinical Observations: generalized weakness Trunk/Postural Assessment  Cervical Assessment Cervical Assessment: Exceptions to WFL(Forward head) Thoracic Assessment Thoracic Assessment: Exceptions to WFL(Rounded shoulders) Lumbar Assessment Lumbar Assessment: Exceptions to WFL(Posterior pelvic tilt) Postural Control Postural Control: Within Functional Limits  Balance Balance Balance Assessed: Yes Dynamic Sitting Balance Dynamic Sitting - Balance Support: Feet supported;During functional activity Dynamic Sitting - Level of Assistance: 5: Stand by assistance;6: Modified independent (Device/Increase time) Sitting balance - Comments: Sitting to complete bathing/dressing tasks Static Standing Balance Static Standing - Balance Support: During functional activity Static Standing - Level of Assistance: 6: Modified independent (Device/Increase time) Dynamic Standing Balance Dynamic Standing - Balance Support: During functional  activity;Right upper extremity supported;Left upper extremity supported Dynamic Standing - Level of Assistance: 6: Modified independent (Device/Increase time);5: Stand by assistance Dynamic Standing - Comments: Standing to compelte bathing/dressing tasks Extremity/Trunk Assessment RUE Assessment RUE Assessment: Within Functional Limits LUE Assessment LUE Assessment: Within Functional Limits   Ladavion Savitz L 05/23/2018, 12:43 PM

## 2018-05-23 NOTE — Progress Notes (Signed)
Social Work Discharge Note  The overall goal for the admission was met for:   Discharge location: Yes - home with family to provide 24/7 supervision  Length of Stay: Yes - 7 days  Discharge activity level: Yes - supervision  Home/community participation: Yes  Services provided included: MD, RD, PT, OT, SLP, RN, Pharmacy, Neuropsych and SW  Financial Services: Medicare and Private Insurance: [employer]  Follow-up services arranged: Home Health: PT/OT/ST from [facility], DME: Rolling walker from [employer] and Patient/Family has no preference for HH/DME agencies  Comments (or additional information): CSW met pt's dtr, Crystal, who stated that family members are planning to be with pt initially at home.  Explained that DME and f/u therapies were ordered/arranged.  Pt/dtr expressed understanding.    Patient/Family verbalized understanding of follow-up arrangements: Yes  Individual responsible for coordination of the follow-up plan: pt and her children  Confirmed correct DME delivered: Trey Sailors 05/23/2018    Alletta Mattos, Silvestre Mesi

## 2018-05-23 NOTE — Discharge Summary (Addendum)
NAMEOREL, HORD MEDICAL RECORD LK:[id number] ACCOUNT [id number] DATE OF BIRTH:[birth year] FACILITY: [facility]: [facility] PHYSICIAN:Arvie Villarruel, MD  DISCHARGE SUMMARY  DATE OF DISCHARGE:  05/24/2018  DISCHARGE DIAGNOSES: 1.  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 2.  Deep venous thrombosis prophylaxis with Eliquis. 3.  Pain management. 4.  Atrial fibrillation with rapid ventricular response. 5.  Hypertension. 6.  Hyperthyroidism. 7.  Suspect chronic kidney disease. 8.  Hyperlipidemia. 9.  Chronic low back pain.  HOSPITAL COURSE:  This is a 72 year old right-handed female with history of chronic back pain, maintained on Neurontin as well as Percocet, CAD, hypertension, hyperlipidemia.  Lives alone.  Independent prior to admission.  She does have family in the  area.  Presented to [facility] after being found down.  Poorly responsive and hypoxic.  Per EMS report, saturations in the 50s.  Poor respiratory effort.  She did respond to Narcan.  Noted to be in atrial fibrillation with RVR.  Placed on  intravenous heparin.  She was transferred to [facility] for further evaluation.  Urine drug screen positive for opiates and benzos.  Chemistries with creatinine 2.40, baseline 1.0.  CK 240.  WBC 17,000.  Troponin 1.78-3.7.  Cranial CT scan  reviewed.  Unremarkable for acute process.  Chest x-ray:  No infiltrate.  Unilateral pelvic films question very subtle.  Nondisplaced left femoral neck fracture cannot be excluded.  No dislocation.  Advised conservative care.  Followup MRI.  Bilateral  globus pallidus restricted diffusion reflecting possible acute injury from hypoxia.  Carotid Dopplers with 40% to 59% left ICA stenosis.  EEG indicative of severe encephalopathy.  Elevated troponin felt to be related to demand ischemia.  Echocardiogram  with ejection fraction of 65%, grade II diastolic dysfunction.   Neurology consulted.  Maintained on Eliquis for CVA prophylaxis as well as atrial fibrillation.  The patient was admitted for a comprehensive rehab program.  PAST MEDICAL HISTORY:  See discharge diagnoses.  SOCIAL HISTORY:  Lives alone, independent prior to admission.  She does have family in the area.  FUNCTIONAL STATUS:  Upon admission to rehab services, moderate assist with stand pivot transfers, total assist squat pivot transfers, min mod assist with activities of daily living.  PHYSICAL EXAMINATION: VITAL SIGNS:  Blood pressure 160/99, pulse 99, temperature 99, respirations 15. GENERAL:  Alert female, oriented to person and place.  She had some decreased insight and awareness of her deficits.  Followed basic commands. HEENT:  EOMs intact. NECK:  Supple, nontender, no JVD. CARDIOVASCULAR:  Rate controlled. ABDOMEN:  Soft, nontender, good bowel sounds. LUNGS:  Clear to auscultation without wheeze.  REHABILITATION HOSPITAL COURSE:  The patient was admitted to inpatient rehab services.  Therapies initiated on a 3-hour daily basis, consisting of physical therapy, occupational therapy and rehab nursing as well as speech therapy.  The following issues  were addressed during patient's rehab stay.  Pertaining to the patient's cardioembolic infarctions, remained stable.  Anoxic encephalopathy identified per EEG.  She remained on Eliquis in light of CVA prophylaxis as well as history of atrial  fibrillation.  No bleeding episodes.  Blood pressure is controlled with hydrochlorothiazide as well as Toprol.  She will continue on Tapazole for history of hyperthyroidism.  CKD.  Creatinine baseline 1.2, monitored.  She would follow up with her primary  MD.  Lipitor as well as Zetia for hyperlipidemia.  Chronic low back pain.  She was on Percocet at home.  Lumbar films show degenerative  disk.  Conservative care of narcotics in light of possible unintentional drug overdose.  The patient received weekly   collaborative interdisciplinary team conferences to discuss estimated length of stay, family teaching, any barriers to discharge.  She could transfer supine to edge of bed supervision.  Ambulates 50 feet without assistive device.  Transferred edge of bed  supine with supervision.  Gathered belongings for activities of daily living and homemaking.  It was advised ongoing supervision for her safety.  She was discharged to home.  DISCHARGE MEDICATIONS:  Included Eliquis 5 mg p.o. b.i.d., Lipitor 80 mg p.o. daily, Zetia 10 mg p.o. daily, hydrochlorothiazide 12.5 mg p.o. daily, Tapazole 10 mg p.o. daily, Toprol 100 mg p.o. daily, Protonix 80 mg p.o. daily, potassium chloride 10 mEq  p.o. daily, Tylenol as needed,Wellbutrin 150 mg daily tramadol 50 mg every 6 hours as needed for pain.  DIET:  Regular.  FOLLOWUP:  She would follow up with Dr. Maryla MorrowAnkit Angelica Wix at the outpatient rehab service office as directed; Dr. Delia HeadyPramod Sethi, call for appointment; Dr. Barron Alvineavid Gibson, primary care, medical management.  SPECIAL INSTRUCTIONS:  No driving.  No smoking, no alcohol.  Supervision for safety.  LN/NUANCE D:05/23/2018 T:05/23/2018 JOB:[id number]  Patient seen and examined by me on day of discharge. Maryla MorrowAnkit Candy Leverett, MD, ABPMR

## 2018-05-23 NOTE — Progress Notes (Signed)
Physical Therapy Discharge Summary  Patient Details  Name: Stephanie Hahn MRN: [id number] Date of Birth: Aug 08, 1945  Today's Date: 05/23/2018 PT Individual Time: 0915-0945 PT Individual Time Calculation (min): 30 min    Patient has met 9 of 10 long term goals due to improved activity tolerance, improved balance, improved postural control, ability to compensate for deficits and functional use of  right upper extremity and right lower extremity.  Patient to discharge at an ambulatory level Supervision.   Patient's care partner is independent to provide the necessary physical and cognitive assistance at discharge.  Reasons goals not met: Stair goal unmet; pt refusal to perform stating she does not need to do them at home and will not perform in therapy.   Recommendation:  Patient will benefit from ongoing skilled PT services in home health setting to continue to advance safe functional mobility, address ongoing impairments in strength, balance, activity tolerance, and minimize fall risk.  Equipment: RW  Reasons for discharge: treatment goals met and discharge from hospital  Patient/family agrees with progress made and goals achieved: Yes  PT Discharge Precautions/Restrictions Precautions Precautions: Fall Restrictions Weight Bearing Restrictions: No Pain Pain Assessment Pain Scale: 0-10 Pain Score: 4  Pain Type: Chronic pain Pain Location: Generalized Pain Descriptors / Indicators: Aching Pain Onset: Gradual Patients Stated Pain Goal: 0 Pain Intervention(s): Medication (See eMAR) Vision/Perception  Perception Perception: Within Functional Limits Praxis Praxis: Intact  Cognition Overall Cognitive Status: Impaired/Different from baseline Arousal/Alertness: Awake/alert Orientation Level: Oriented X4 Attention: Selective Focused Attention: Impaired Focused Attention Impairment: Verbal basic;Functional basic Selective Attention: Impaired Awareness: Impaired Awareness  Impairment: Intellectual impairment Problem Solving: Impaired Problem Solving Impairment: Functional basic;Verbal basic Safety/Judgment: Impaired Comments: Impulsive, poor safety awareness and awareness of deficits Sensation Sensation Light Touch: Appears Intact Coordination Gross Motor Movements are Fluid and Coordinated: Yes Fine Motor Movements are Fluid and Coordinated: Yes Motor  Motor Motor: Within Functional Limits Motor - Skilled Clinical Observations: generalized weakness  Mobility Bed Mobility Bed Mobility: Supine to Sit;Sit to Supine Supine to Sit: Independent Sit to Supine: Independent Transfers Transfers: Sit to [employer] Transfers Sit to Stand: Independent with assistive device Stand to Sit: Independent with assistive device Stand Pivot Transfers: Independent with assistive device Transfer (Assistive device): Rolling walker Locomotion  Gait Ambulation: Yes Gait Assistance: Supervision/Verbal cueing Gait Distance (Feet): 150 Feet Assistive device: Rolling walker Gait Assistance Details: Verbal cues for precautions/safety Gait Gait: Yes Gait Pattern: Impaired Gait Pattern: Decreased stance time - left Gait velocity: decreased for age norms Stairs / Additional Locomotion Stairs: No(refused) Ramp: Supervision/Verbal cueing Curb: Engineer, maintenance (IT) Mobility: No(pt ambulatory)  Trunk/Postural Assessment  Cervical Assessment Cervical Assessment: Exceptions to WFL(Forward head) Thoracic Assessment Thoracic Assessment: Exceptions to WFL(Rounded shoulders) Lumbar Assessment Lumbar Assessment: Exceptions to WFL(Posterior pelvic tilt) Postural Control Postural Control: Within Functional Limits  Balance Balance Balance Assessed: Yes Dynamic Sitting Balance Dynamic Sitting - Balance Support: Feet supported;During functional activity Dynamic Sitting - Level of Assistance: 5: Stand by assistance;6: Modified  independent (Device/Increase time) Sitting balance - Comments: Sitting to complete bathing/dressing tasks Static Standing Balance Static Standing - Balance Support: During functional activity Static Standing - Level of Assistance: 6: Modified independent (Device/Increase time) Dynamic Standing Balance Dynamic Standing - Balance Support: During functional activity;Right upper extremity supported;Left upper extremity supported Dynamic Standing - Level of Assistance: 6: Modified independent (Device/Increase time);5: Stand by assistance Dynamic Standing - Comments: Standing to compelte bathing/dressing tasks Extremity Assessment  RUE Assessment RUE Assessment: Within Functional Limits LUE Assessment LUE  Assessment: Within Functional Limits RLE Assessment RLE Assessment: Exceptions to [facility] Strength Comments: grossly, 4+/5 except R ankle DF 4/5 LLE Assessment LLE Assessment: Within Functional Limits General Strength Comments: grossly, 4+/5  Skilled Therapeutic Intervention: Pt received in bed, denies pain but requires significantly increased time to engage with therapist and participate following extensive encouragement and education. Pt performed mobility as above with S overall for gait, modI transfers and bed mobility. Pt refused performance of stairs, stating she did not need to do stairs at home and does not plan to go out into community; therapist encouraged pt to perform but pt continued to decline. Pt with no further questions or concerns regarding d/c home. Pt refused participation in remaining time of therapy session despite encouragement. Remained in bed, alarm intact and all needs in reach.    Benjiman Core Ponciano Shealy 05/23/2018, 1:01 PM

## 2018-05-24 MED ORDER — HYDROCHLOROTHIAZIDE 12.5 MG PO CAPS: 12.5000 mg | ORAL_CAPSULE | Freq: Every day | ORAL | 1 refills | Status: AC

## 2018-05-24 MED ORDER — METOPROLOL SUCCINATE ER 100 MG PO TB24: 100.0000 mg | ORAL_TABLET | Freq: Every day | ORAL | 1 refills | Status: AC

## 2018-05-24 MED ORDER — BUPROPION HCL ER (XL) 150 MG PO TB24: 150.0000 mg | ORAL_TABLET | Freq: Every morning | ORAL | 6 refills | Status: AC

## 2018-05-24 MED ORDER — METHIMAZOLE 10 MG PO TABS: 10.0000 mg | ORAL_TABLET | Freq: Every day | ORAL | 0 refills | Status: AC

## 2018-05-24 MED ORDER — POTASSIUM CHLORIDE CRYS ER 10 MEQ PO TBCR: 10.0000 meq | EXTENDED_RELEASE_TABLET | Freq: Every day | ORAL | 0 refills | Status: AC

## 2018-05-24 MED ORDER — OMEPRAZOLE 40 MG PO CPDR: 40.0000 mg | DELAYED_RELEASE_CAPSULE | Freq: Every day | ORAL | 6 refills | Status: AC

## 2018-05-24 MED ORDER — TRAMADOL HCL 50 MG PO TABS: 50.0000 mg | ORAL_TABLET | Freq: Four times a day (QID) | ORAL | 0 refills | Status: DC | PRN

## 2018-05-24 MED ORDER — EZETIMIBE 10 MG PO TABS: 10.0000 mg | ORAL_TABLET | Freq: Every day | ORAL | 1 refills | Status: AC

## 2018-05-24 MED ORDER — ACETAMINOPHEN 325 MG PO TABS: 650.0000 mg | ORAL_TABLET | Freq: Four times a day (QID) | ORAL | Status: AC | PRN

## 2018-05-24 MED ORDER — NITROGLYCERIN 0.4 MG SL SUBL: 0.4000 mg | SUBLINGUAL_TABLET | SUBLINGUAL | 3 refills | Status: AC | PRN

## 2018-05-24 MED ORDER — ATORVASTATIN CALCIUM 80 MG PO TABS: 80.0000 mg | ORAL_TABLET | Freq: Every day | ORAL | 1 refills | Status: AC

## 2018-05-24 MED ORDER — APIXABAN 5 MG PO TABS: 5.0000 mg | ORAL_TABLET | Freq: Two times a day (BID) | ORAL | 1 refills | Status: AC

## 2018-05-24 NOTE — Progress Notes (Signed)
Patient discharged home.  Left floor via wheelchair, escorted by nursing staff and family.  Patient verbalized understanding of discharge instructions as given by Dan Angiulli, PA.  All patient belongings sent with patient, including DME and prescriptions.  Appears to be in no immediate distress at this time.  Moni Rothrock J, RN  

## 2018-05-25 ENCOUNTER — Telehealth: Payer: Self-pay

## 2018-05-25 NOTE — Telephone Encounter (Signed)
Transitional Care call-called and left a non-detailed message for patient to call back because no DPR     1. Are you/is patient experiencing any problems since coming home? Are there any questions regarding any aspect of care? 2. Are there any questions regarding medications administration/dosing? Are meds being taken as prescribed? Patient should review meds with caller to confirm 3. Have there been any falls? 4. Has Home Health been to the house and/or have they contacted you? If not, have you tried to contact them? Can we help you contact them? 5. Are bowels and bladder emptying properly? Are there any unexpected incontinence issues? If applicable, is patient following bowel/bladder programs? 6. Any fevers, problems with breathing, unexpected pain? 7. Are there any skin problems or new areas of breakdown? 8. Has the patient/family member arranged specialty MD follow up (ie cardiology/neurology/renal/surgical/etc)?  Can we help arrange? 9. Does the patient need any other services or support that we can help arrange? 10. Are caregivers following through as expected in assisting the patient? 11. Has the patient quit smoking, drinking alcohol, or using drugs as recommended?  Appointment time, arrive time 2:20 for 2:40 appt with Riley LamEunice then back to for Dr. Allena KatzPatel [phone]

## 2018-06-07 ENCOUNTER — Encounter: Payer: Medicare Other | Attending: Registered Nurse | Admitting: Registered Nurse

## 2018-06-09 ENCOUNTER — Encounter: Payer: Self-pay | Admitting: Neurology

## 2018-07-25 NOTE — Progress Notes (Signed)
NEUROLOGY CONSULTATION NOTE  Stephanie PebblesSara Holte MRN: [id number] DOB: 1946/01/19  Referring provider: Barron Alvineavid Gibson, MD Primary care provider: Barron Alvineavid Gibson, MD  Reason for consult:  Stroke, anoxic encephalopathy  HISTORY OF PRESENT ILLNESS: Stephanie Hahn is a 73 year old right-handed African American woman with CAD, hypertension, hyperthyroidism, multinodular goiter, depression and chronic back pain who presents for stroke with anoxic encephalopathy.  History also supplemented by hospital and rehab notes.  She was [facility] after she was found on the floor hypoxic (saturating in the 50s) and minimally responsive.  She responded to Narcan.  She exhibited seizure-like activity that aborted with Versed.  UDS was positive for opiates and benzo, as she is treated for chronic back pain.  MRI of brain personally reviewed demonstrated bilateral globus pallidus ischemia, likely due to hypoxic injury, and right parietal embolic infarcts.  She was also found to have new-onset a fib and NSTEMI type II.  2D echocardiogram revealed EF 65% with no cardiac source of emboli.  Carotid doppler revealed no hemodynamically significant stenosis.  EEG recorded while patient comatose and demonstrated bilateral sharp waves "that built up at times and became rhythmic but no ictal phase noticed in the recording.  Diffuse triphasic waves were also noted that might represent severe encephalopathy".  LDL was 84.  Hgb A1c was 4.4.  She was started on Eliquis.  She was discharged to inpatient rehab.  She is feeling well.  Current medications:  Eliquis, atorvastatin 80mg , Zetia, HCTZ, Toprol XL  PAST MEDICAL HISTORY: Atrial fibrillation Hypertension Hyperthyroidism Coronary artery disease Polio  PAST SURGICAL HISTORY: Hip replacement  MEDICATIONS: Current Outpatient Medications on File Prior to Visit  Medication Sig Dispense Refill  . acetaminophen (TYLENOL) 325 MG tablet Take  2 tablets (650 mg total) by mouth every 6 (six) hours as needed for mild pain (or Fever >/= 101).    Marland Kitchen. apixaban (ELIQUIS) 5 MG TABS tablet Take 1 tablet (5 mg total) by mouth 2 (two) times daily. 60 tablet 1  . atorvastatin (LIPITOR) 80 MG tablet Take 1 tablet (80 mg total) by mouth daily. 30 tablet 1  . buPROPion (WELLBUTRIN XL) 150 MG 24 hr tablet Take 1 tablet (150 mg total) by mouth every morning. 30 tablet 6  . ezetimibe (ZETIA) 10 MG tablet Take 1 tablet (10 mg total) by mouth daily. 30 tablet 1  . hydrochlorothiazide (MICROZIDE) 12.5 MG capsule Take 1 capsule (12.5 mg total) by mouth daily. 30 capsule 1  . methimazole (TAPAZOLE) 10 MG tablet Take 1 tablet (10 mg total) by mouth daily. 30 tablet 0  . metoprolol succinate (TOPROL-XL) 100 MG 24 hr tablet Take 1 tablet (100 mg total) by mouth daily. 30 tablet 1  . nitroGLYCERIN (NITROSTAT) 0.4 MG SL tablet Place 1 tablet (0.4 mg total) under the tongue every 5 (five) minutes as needed for chest pain. 30 tablet 3  . omeprazole (PRILOSEC) 40 MG capsule Take 1 capsule (40 mg total) by mouth daily. 30 capsule 6  . potassium chloride (K-DUR,KLOR-CON) 10 MEQ tablet Take 1 tablet (10 mEq total) by mouth daily. 30 tablet 0  . traMADol (ULTRAM) 50 MG tablet Take 1 tablet (50 mg total) by mouth every 6 (six) hours as needed for severe pain. 30 tablet 0   No current facility-administered medications on file prior to visit.     ALLERGIES: No Known Allergies  FAMILY HISTORY: Family History  Problem Relation Age of Onset  . Alzheimer's  disease Other   . Alzheimer's disease Other    SOCIAL HISTORY: Social History   Socioeconomic History  . Marital status: Divorced    Spouse name: Not on file  . Number of children: Not on file  . Years of education: Not on file  . Highest education level: Not on file  Occupational History  . Not on file  Social Needs  . Financial resource strain: Not on file  . Food insecurity:    Worry: Not on file     Inability: Not on file  . Transportation needs:    Medical: Not on file    Non-medical: Not on file  Tobacco Use  . Smoking status: Former Smoker    Packs/day: 0.20    Years: 50.00    Pack years: 10.00    Types: Cigarettes    Start date: 05/17/1968    Last attempt to quit: 03/17/2018    Years since quitting: 0.3  . Smokeless tobacco: Never Used  Substance and Sexual Activity  . Alcohol use: Yes    Alcohol/week: 6.0 standard drinks    Types: 6 Cans of beer per week  . Drug use: Never  . Sexual activity: Not Currently  Lifestyle  . Physical activity:    Days per week: Not on file    Minutes per session: Not on file  . Stress: Not on file  Relationships  . Social connections:    Talks on phone: Not on file    Gets together: Not on file    Attends religious service: Not on file    Active member of club or organization: Not on file    Attends meetings of clubs or organizations: Not on file    Relationship status: Not on file  . Intimate partner violence:    Fear of current or ex partner: Not on file    Emotionally abused: Not on file    Physically abused: Not on file    Forced sexual activity: Not on file  Other Topics Concern  . Not on file  Social History Narrative   Resides with her brother.     REVIEW OF SYSTEMS: Constitutional: No fevers, chills, or sweats, no generalized fatigue, change in appetite Eyes: No visual changes, double vision, eye pain Ear, nose and throat: No hearing loss, ear pain, nasal congestion, sore throat Cardiovascular: No chest pain, palpitations Respiratory:  No shortness of breath at rest or with exertion, wheezes GastrointestinaI: No nausea, vomiting, diarrhea, abdominal pain, fecal incontinence Genitourinary:  No dysuria, urinary retention or frequency Musculoskeletal:  Left knee pain.   Integumentary: No rash, pruritus, skin lesions Neurological: as above Psychiatric: No depression, insomnia, anxiety Endocrine: No palpitations,  fatigue, diaphoresis, mood swings, change in appetite, change in weight, increased thirst Hematologic/Lymphatic:  No purpura, petechiae. Allergic/Immunologic: no itchy/runny eyes, nasal congestion, recent allergic reactions, rashes  PHYSICAL EXAM: Blood pressure 130/90, resp. rate 16, height 5\' 2"  (1.575 m), weight 206 lb 4 oz (93.6 kg). General: No acute distress.  Patient appears well-groomed.   Head:  Normocephalic/atraumatic Eyes:  fundi examined but not visualized Neck: supple, no paraspinal tenderness, full range of motion Back: No paraspinal tenderness Heart: regular rate and rhythm Lungs: Clear to auscultation bilaterally. Vascular: No carotid bruits. Neurological Exam: Mental status: alert and oriented to person, place, and time, recent and remote memory intact, fund of knowledge intact, attention and concentration intact, speech fluent and not dysarthric, language intact. Cranial nerves: CN I: not tested CN II: pupils equal, round and  reactive to light, visual fields intact CN III, IV, VI:  full range of motion, no nystagmus, no ptosis CN V: facial sensation intact CN VII: upper and lower face symmetric CN VIII: hearing intact CN IX, X: gag intact, uvula midline CN XI: sternocleidomastoid and trapezius muscles intact CN XII: tongue midline Bulk & Tone: normal, no fasciculations. Motor:  5/5 throughout  Sensation: temperature and vibration sensation intact. Deep Tendon Reflexes:  2+ throughout, toes downgoing.   Finger to nose testing:  Without dysmetria.   Gait:  Antalgic, wide-based. Romberg negative.  IMPRESSION: 1.  Acute hypoxic respiratory failure with hypoxic encephalopathy, possibly from opioid overuse 2.  Embolic cerebral ischemic infarcts in setting of new-onset atrial fibrillation 3.  NSTEMI type II, likely demand ischemia in setting of hypoxic event 4.  Atrial fibrillation RVR 5.  Hypertension 6.  Hyperlipidemia  PLAN: 1.  Eliquis for secondary stroke  prevention 2.  On Lipitor 80mg  daily (LDL goal less than 70) 3.  Continue blood pressure and glycemic control 4.  Follow up in 5 months.  Thank you for allowing me to take part in the care of this patient.  Shon Millet, DO  CC: Barron Alvine, MD

## 2018-07-26 ENCOUNTER — Ambulatory Visit: Payer: Federal, State, Local not specified - PPO | Admitting: Neurology

## 2018-07-26 ENCOUNTER — Encounter: Payer: Self-pay | Admitting: Neurology

## 2018-07-26 VITALS — BP 130/90 | Resp 16 | Ht 62.0 in | Wt 206.2 lb

## 2018-07-26 DIAGNOSIS — I10 Essential (primary) hypertension: Secondary | ICD-10-CM | POA: Diagnosis not present

## 2018-07-26 DIAGNOSIS — I480 Paroxysmal atrial fibrillation: Secondary | ICD-10-CM

## 2018-07-26 DIAGNOSIS — I639 Cerebral infarction, unspecified: Secondary | ICD-10-CM

## 2018-07-26 DIAGNOSIS — E785 Hyperlipidemia, unspecified: Secondary | ICD-10-CM

## 2018-07-26 DIAGNOSIS — G931 Anoxic brain damage, not elsewhere classified: Secondary | ICD-10-CM | POA: Diagnosis not present

## 2018-07-26 NOTE — Patient Instructions (Signed)
1.  Continue Eliquis 2.  Continue atorvastatin 3.  Continue blood pressure medications 4.  Follow up in 5 months.

## 2018-12-19 ENCOUNTER — Encounter: Payer: Self-pay | Admitting: Neurology

## 2018-12-26 ENCOUNTER — Ambulatory Visit: Payer: Federal, State, Local not specified - PPO | Admitting: Neurology

## 2019-08-16 IMAGING — CR DG CHEST 1V PORT
1 series · 1 of 1 positions shown · non-contrast
Comparison: 02/08/2016

CLINICAL DATA: Recently found unresponsive

EXAM:
PORTABLE CHEST 1 VIEW

[chest ap]
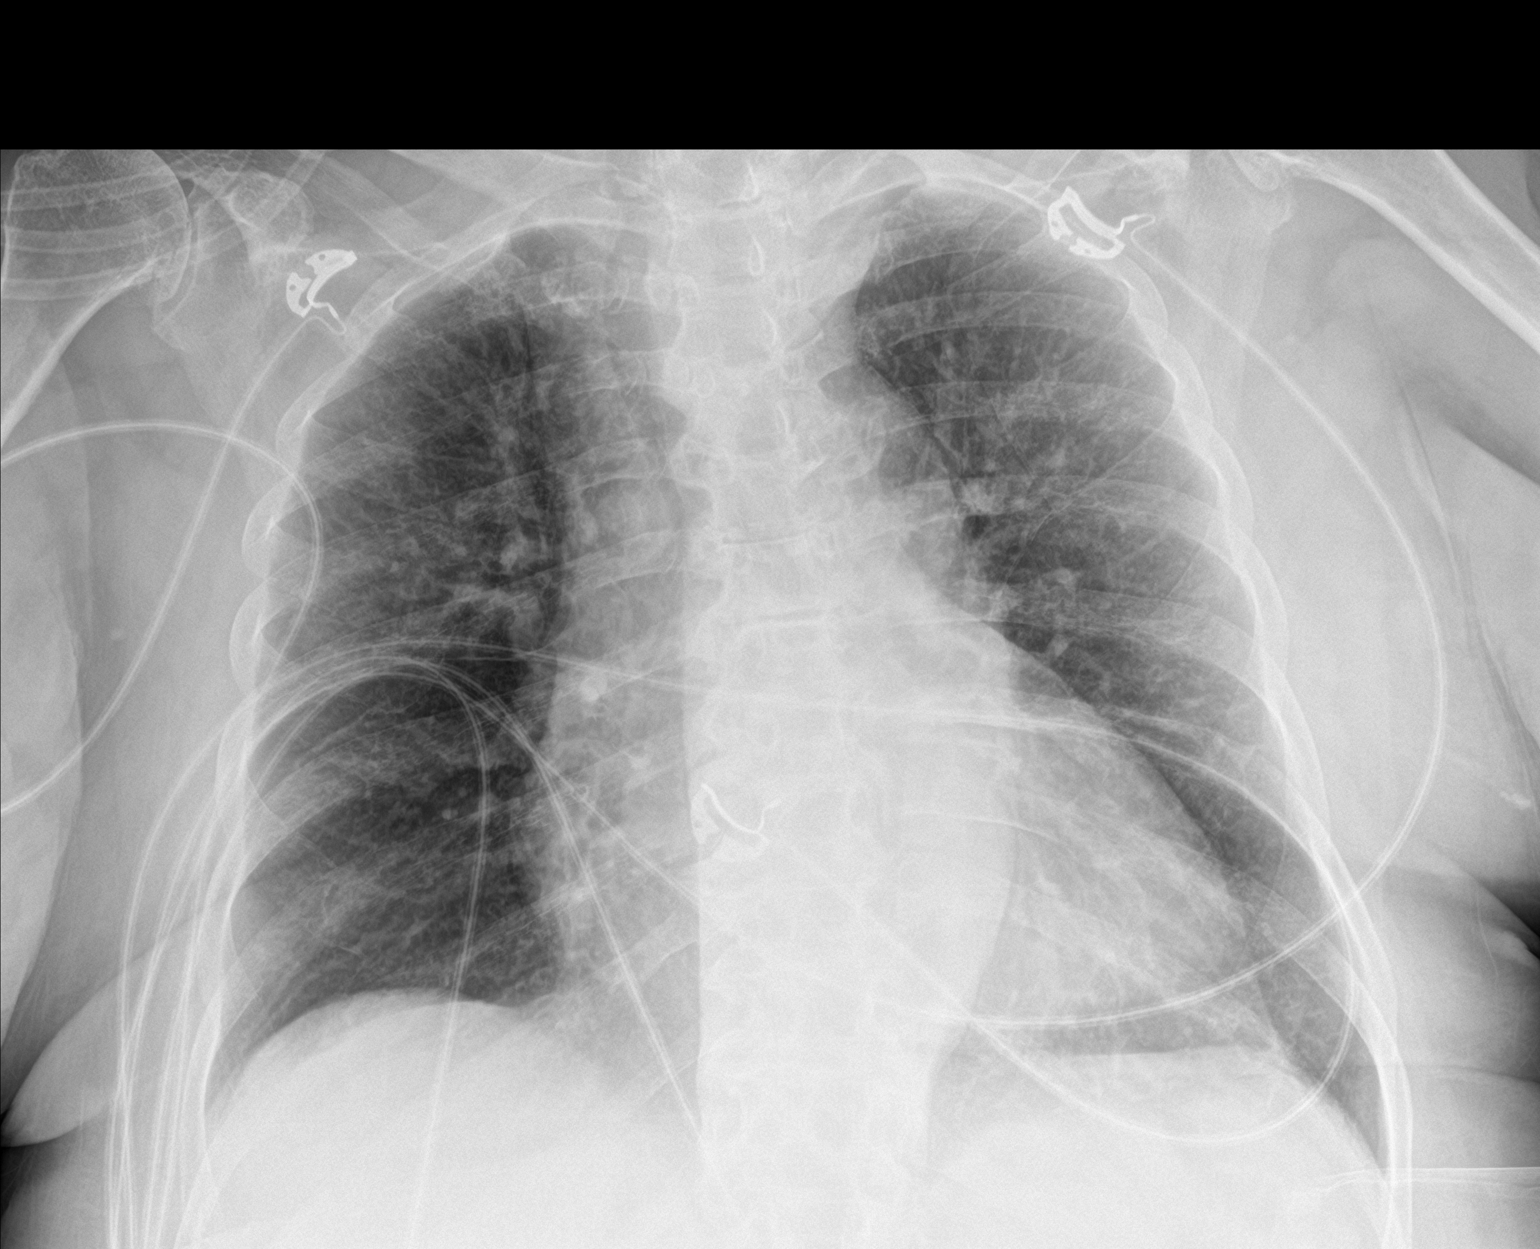

[1 of 1 positions shown; findings below may reference images not displayed]

FINDINGS: Cardiac shadow is mildly prominent but stable. The lungs are well
aerated bilaterally. Patchy interstitial markings are noted
particularly in the apices which may be related to some underlying
bronchitic markings. No sizable infiltrate or effusion is seen. No
pneumothorax is noted. Degenerative change of the thoracic spine is
noted
IMPRESSION: Increased interstitial markings particularly in the apices. No focal
confluent infiltrate is noted.
